# Patient Record
Sex: Female | Born: 1980
Health system: Southern US, Community
[De-identification: ages and names within clinical notes are randomized; demographics above are authoritative.]

## PROBLEM LIST (undated history)

## (undated) DIAGNOSIS — K2970 Gastritis, unspecified, without bleeding: Secondary | ICD-10-CM

## (undated) DIAGNOSIS — R300 Dysuria: Secondary | ICD-10-CM

## (undated) DIAGNOSIS — F410 Panic disorder [episodic paroxysmal anxiety] without agoraphobia: Secondary | ICD-10-CM

## (undated) DIAGNOSIS — E559 Vitamin D deficiency, unspecified: Secondary | ICD-10-CM

## (undated) DIAGNOSIS — G4700 Insomnia, unspecified: Secondary | ICD-10-CM

## (undated) DIAGNOSIS — I10 Essential (primary) hypertension: Secondary | ICD-10-CM

## (undated) DIAGNOSIS — R5383 Other fatigue: Secondary | ICD-10-CM

## (undated) DIAGNOSIS — R5381 Other malaise: Secondary | ICD-10-CM

## (undated) HISTORY — DX: Morbid (severe) obesity due to excess calories: E66.01

## (undated) HISTORY — DX: Essential (primary) hypertension: I10

## (undated) HISTORY — PX: TONSILLECTOMY: SHX5217

## (undated) HISTORY — DX: Vitamin D deficiency, unspecified: E55.9

## (undated) HISTORY — DX: Dysuria: R30.0

## (undated) HISTORY — DX: Other malaise: R53.83

## (undated) HISTORY — DX: Other malaise: R53.81

## (undated) HISTORY — DX: Panic disorder (episodic paroxysmal anxiety): F41.0

## (undated) HISTORY — DX: Insomnia, unspecified: G47.00

## (undated) HISTORY — DX: Gastritis, unspecified, without bleeding: K29.70

## (undated) HISTORY — PX: TUBAL LIGATION: SHX77

---

## 1998-06-20 ENCOUNTER — Other Ambulatory Visit: Admission: RE | Admit: 1998-06-20 | Discharge: 1998-06-20 | Payer: Self-pay | Admitting: Gynecology

## 1998-09-11 ENCOUNTER — Ambulatory Visit (HOSPITAL_COMMUNITY): Admission: RE | Admit: 1998-09-11 | Discharge: 1998-09-11 | Payer: Self-pay | Admitting: Obstetrics

## 1998-10-26 ENCOUNTER — Ambulatory Visit (HOSPITAL_COMMUNITY): Admission: RE | Admit: 1998-10-26 | Discharge: 1998-10-26 | Payer: Self-pay | Admitting: *Deleted

## 1998-11-25 ENCOUNTER — Emergency Department (HOSPITAL_COMMUNITY): Admission: EM | Admit: 1998-11-25 | Discharge: 1998-11-25 | Payer: Self-pay | Admitting: Emergency Medicine

## 2000-09-16 ENCOUNTER — Other Ambulatory Visit: Admission: RE | Admit: 2000-09-16 | Discharge: 2000-09-16 | Payer: Self-pay | Admitting: Family Medicine

## 2001-03-06 ENCOUNTER — Emergency Department (HOSPITAL_COMMUNITY): Admission: EM | Admit: 2001-03-06 | Discharge: 2001-03-06 | Payer: Self-pay

## 2004-01-18 ENCOUNTER — Emergency Department (HOSPITAL_COMMUNITY): Admission: EM | Admit: 2004-01-18 | Discharge: 2004-01-18 | Payer: Self-pay | Admitting: Family Medicine

## 2004-02-23 ENCOUNTER — Ambulatory Visit (HOSPITAL_BASED_OUTPATIENT_CLINIC_OR_DEPARTMENT_OTHER): Admission: RE | Admit: 2004-02-23 | Discharge: 2004-02-23 | Payer: Self-pay | Admitting: Otolaryngology

## 2004-02-23 ENCOUNTER — Encounter (INDEPENDENT_AMBULATORY_CARE_PROVIDER_SITE_OTHER): Payer: Self-pay | Admitting: Specialist

## 2004-02-23 ENCOUNTER — Ambulatory Visit (HOSPITAL_COMMUNITY): Admission: RE | Admit: 2004-02-23 | Discharge: 2004-02-23 | Payer: Self-pay | Admitting: Otolaryngology

## 2006-05-09 ENCOUNTER — Ambulatory Visit (HOSPITAL_COMMUNITY): Admission: RE | Admit: 2006-05-09 | Discharge: 2006-05-09 | Payer: Self-pay | Admitting: Obstetrics & Gynecology

## 2007-06-03 ENCOUNTER — Ambulatory Visit (HOSPITAL_COMMUNITY): Admission: RE | Admit: 2007-06-03 | Discharge: 2007-06-03 | Payer: Self-pay | Admitting: Obstetrics

## 2008-12-06 ENCOUNTER — Emergency Department (HOSPITAL_COMMUNITY): Admission: EM | Admit: 2008-12-06 | Discharge: 2008-12-06 | Payer: Self-pay | Admitting: Emergency Medicine

## 2011-01-04 NOTE — Op Note (Signed)
Dorothy Sullivan, Dorothy Sullivan            ACCOUNT NO.:  [id number]   MEDICAL RECORD NO.:  [id number]          PATIENT TYPE:  AMB   LOCATION:  SDC                           FACILITY:  [facility]   PHYSICIAN:  Roseanna Rainbow, M.D.DATE OF BIRTH:  1981-08-16   DATE OF PROCEDURE:  05/09/2006  DATE OF DISCHARGE:                                 OPERATIVE REPORT   PREOPERATIVE DIAGNOSIS:  Desires sterilization procedure.   POSTOPERATIVE DIAGNOSIS:  Desires sterilization procedure.   PROCEDURE:  Laparoscopic bilateral tubal ligation with fulguration.   SURGEON:  Dr. Tamela Oddi, Dr. Clearance Coots   ANESTHESIA:  General tracheal.   ESTIMATED BLOOD LOSS:  Minimal.   COMPLICATIONS:  None.   IV FLUIDS:  As per anesthesiology.   URINE OUTPUT:  100 mL urine at the beginning of the procedure.   FINDINGS:  There were adhesions involving the omentum to the parietal  peritoneum of the anterior abdominal wall.  The ovary on the left side was  involved in these adhesions which were fairly dense.  The adnexa was put on  stretch, and there was slight twisting of the adnexa related to this  adhesion.  The fimbriated end of the tube could not be visualized secondary  to the adhesions on the left side.   PROCEDURE:  The patient is taken to the operating room with an IV running.  She was given general anesthesia, placed in the dorsal lithotomy position,  and prepped and draped in the usual sterile fashion.  A bivalve speculum was  placed in the patient's vagina.  The anterior lip of the cervix was grasped  with a single-tooth tenaculum.  A Hulka manipulator was then advanced into  the uterus and secured to the anterior lip of the cervix as a means to  manipulate the uterus.  The single-tooth tenaculum and speculum were then  removed.  A midline supraumbilical skin incision was then made with a  scalpel and carried down to the underlying fascia.  The fascia was tented up  and entered sharply.  The posterior  rectus sheath was tented up and entered  sharply.  The parietal peritoneum was tented up and entered sharply as well.  The Hasson trocar and sleeve were then advanced into the abdomen.  The  abdomen was then insufflated with CO2 gas.  A survey of the pelvic viscera  revealed the above findings.  The mid isthmic portion of the right fallopian  tube was then cauterized.  A 2 cm segment of tube was cauterized  contiguously.  With each application, the ohmmeter was noted to go to zero.  The left fallopian tube was manipulated in a similar fashion.  All the  instruments were removed from the abdomen.  The fascia was reapproximated  with 0 Vicryl in a running fashion.  The skin was reapproximated with  interrupted sutures of 3-0 Monocryl and Dermabond.  The Hulka  manipulator was removed from the vagina with minimal bleeding noted.  At the  close of the procedure, the instrument and pack counts were said to be  correct x2.  The patient was taken to the PACU awake  and in stable  condition.      Roseanna Rainbow, M.D.  Electronically Signed     LAJ/MEDQ  D:  05/09/2006  T:  05/12/2006  Job:  [id number]

## 2011-01-04 NOTE — Op Note (Signed)
Dorothy Sullivan, Dorothy Sullivan                        ACCOUNT NO.:  [id number]   MEDICAL RECORD NO.:  [id number]                   PATIENT TYPE:  AMB   LOCATION:  DSC                                  FACILITY:  [facility]   PHYSICIAN:  Jefry H. Pollyann Kennedy, M.D.                DATE OF BIRTH:  [birth year]   DATE OF PROCEDURE:  02/23/2004  DATE OF DISCHARGE:                                 OPERATIVE REPORT   PREOPERATIVE DIAGNOSES:  Chronic tonsillitis.   POSTOPERATIVE DIAGNOSES:  Chronic tonsillitis.   OPERATION PERFORMED:  Tonsillectomy.   SURGEON:  Jefry H. Pollyann Kennedy, M.D.   ANESTHESIA:  General endotracheal.   COMPLICATIONS:  None.   ESTIMATED BLOOD LOSS:  Minimal.   FINDINGS:  Large tonsils with cryptic spaces and debris.  No adenoid tissue  present.   REFERRING PHYSICIAN:  Louise A. Twiselton, M.D.   INDICATIONS FOR PROCEDURE:  The patient is a 30 year old with a history of  chronic and recurring tonsillopharyngitis.  The risks, benefits,  alternatives and complications of the procedure were explained to the  patient, who seemed to understand and agreed to surgery.   DESCRIPTION OF PROCEDURE:  The patient was taken to the operating room and  placed on the operating table in the supine position.  Following induction  of general endotracheal anesthesia, the table was turned 90 degrees and the  patient was draped in standard fashion.  A Crowe-Davis mouth gag was  inserted into the oral cavity and used to retract the tongue and mandible  and attached to the Mayo stand.  Inspection of the palate revealed no  evidence of a submucous cleft or shortening of the soft palate.  A red  rubber catheter was inserted into the right side of the nose and withdrawn  through the mouth and used to retract the soft palate and uvula.  Indirect  exam of the nasopharynx was performed and the nasopharynx was clear.  The  tonsillectomy was performed using electrocautery dissection, carefully  dissecting the  avascular plane between the capsule and the constrictor  muscles.  There was minimal bleeding encountered.  Spot cautery was used as  needed.  Tonsils were sent together for pathologic evaluation.  The pharynx  was suctioned of secretions and irrigated with saline.  An orogastric tube  was used to aspirate the contents of the stomach.  The patient was then  awakened, extubated and transferred to recovery in stable condition.                                               Jefry H. Pollyann Kennedy, M.D.    JHR/MEDQ  D:  02/23/2004  T:  02/23/2004  Job:  [id number]

## 2012-12-14 ENCOUNTER — Ambulatory Visit (INDEPENDENT_AMBULATORY_CARE_PROVIDER_SITE_OTHER): Payer: 59 | Admitting: Obstetrics

## 2012-12-14 ENCOUNTER — Encounter: Payer: Self-pay | Admitting: Obstetrics

## 2012-12-14 VITALS — BP 119/84 | HR 80 | Temp 98.0°F | Ht 65.0 in | Wt 330.0 lb

## 2012-12-14 DIAGNOSIS — A499 Bacterial infection, unspecified: Secondary | ICD-10-CM

## 2012-12-14 DIAGNOSIS — N760 Acute vaginitis: Secondary | ICD-10-CM

## 2012-12-14 DIAGNOSIS — B9689 Other specified bacterial agents as the cause of diseases classified elsewhere: Secondary | ICD-10-CM

## 2012-12-14 DIAGNOSIS — N390 Urinary tract infection, site not specified: Secondary | ICD-10-CM

## 2012-12-14 DIAGNOSIS — Z01419 Encounter for gynecological examination (general) (routine) without abnormal findings: Secondary | ICD-10-CM

## 2012-12-14 DIAGNOSIS — Z113 Encounter for screening for infections with a predominantly sexual mode of transmission: Secondary | ICD-10-CM

## 2012-12-14 HISTORY — DX: Other specified bacterial agents as the cause of diseases classified elsewhere: B96.89

## 2012-12-14 HISTORY — DX: Acute vaginitis: N76.0

## 2012-12-14 LAB — POCT URINALYSIS DIPSTICK
Bilirubin, UA: NEGATIVE
Glucose, UA: NEGATIVE
Ketones, UA: NEGATIVE
Leukocytes, UA: NEGATIVE
Nitrite, UA: POSITIVE
Protein, UA: NEGATIVE
Spec Grav, UA: 1.02
Urobilinogen, UA: NEGATIVE
pH, UA: 5

## 2012-12-14 MED ORDER — METRONIDAZOLE 500 MG PO TABS: 500.0000 mg | ORAL_TABLET | Freq: Three times a day (TID) | ORAL | Status: DC

## 2012-12-14 MED ORDER — PRENATAL PLUS IRON 29-1 MG PO TABS: 1.0000 | ORAL_TABLET | Freq: Every day | ORAL | Status: DC

## 2012-12-14 NOTE — Progress Notes (Signed)
.   Subjective:     Dorothy Sullivan is a 32 y.o. female here for a annual exam.  No current complaints.  Personal health questionnaire reviewed: yes.   Gynecologic History Patient's last menstrual period was 12/01/2012. Contraception: tubal ligation Last Pap: 11/27/2011. Results were: normal Last mammogram: N/A  Obstetric History OB History   Grav Para Term Preterm Abortions TAB SAB Ect Mult Living                   The following portions of the patient's history were reviewed and updated as appropriate: allergies, current medications, past family history, past medical history, past social history, past surgical history and problem list.  Review of Systems Pertinent items are noted in HPI.    Objective:    General appearance: alert and no distress Breasts: normal appearance, no masses or tenderness Abdomen: normal findings: soft, non-tender Pelvic: cervix normal in appearance, external genitalia normal, no adnexal masses or tenderness, no cervical motion tenderness, uterus normal size, shape, and consistency and Vagina with thin gray discharge.    Assessment:    Healthy female exam.  BV.   Plan:    Education reviewed: safe sex/STD prevention and self breast exams. Follow up in: 1 year.   Flagyl Rx.

## 2012-12-15 ENCOUNTER — Encounter: Payer: Self-pay | Admitting: Obstetrics

## 2012-12-15 ENCOUNTER — Other Ambulatory Visit: Payer: Self-pay | Admitting: *Deleted

## 2012-12-15 LAB — GC/CHLAMYDIA PROBE AMP: GC Probe RNA: NEGATIVE

## 2012-12-15 LAB — PAP IG W/ RFLX HPV ASCU

## 2012-12-15 NOTE — Patient Instructions (Signed)
+  BV

## 2012-12-16 LAB — URINE CULTURE: Colony Count: >=100000

## 2012-12-22 ENCOUNTER — Other Ambulatory Visit: Payer: Self-pay | Admitting: *Deleted

## 2012-12-22 MED ORDER — NITROFURANTOIN MONOHYD MACRO 100 MG PO CAPS: 100.0000 mg | ORAL_CAPSULE | Freq: Two times a day (BID) | ORAL | Status: AC

## 2012-12-22 NOTE — Progress Notes (Signed)
Pt aware of results and prescription sent to pt's pharmacy. Pt expressed understanding.   

## 2013-09-19 ENCOUNTER — Emergency Department (INDEPENDENT_AMBULATORY_CARE_PROVIDER_SITE_OTHER)
Admission: EM | Admit: 2013-09-19 | Discharge: 2013-09-19 | Disposition: A | Discharge status: Home / Self Care | Payer: 59 | Source: Home / Self Care | Attending: Family Medicine | Admitting: Family Medicine

## 2013-09-19 ENCOUNTER — Encounter (HOSPITAL_COMMUNITY): Payer: Self-pay | Admitting: Emergency Medicine

## 2013-09-19 DIAGNOSIS — R6889 Other general symptoms and signs: Secondary | ICD-10-CM

## 2013-09-19 LAB — POCT RAPID STREP A: Streptococcus, Group A Screen (Direct): NEGATIVE

## 2013-09-19 MED ORDER — IBUPROFEN 800 MG PO TABS
800.0000 mg | ORAL_TABLET | Freq: Once | ORAL | Status: AC
  Administered 2013-09-19: 800 mg via ORAL

## 2013-09-19 MED ORDER — IBUPROFEN 800 MG PO TABS: 800.0000 mg | ORAL_TABLET | Freq: Three times a day (TID) | ORAL | Status: DC | PRN

## 2013-09-19 MED ORDER — OSELTAMIVIR PHOSPHATE 75 MG PO CAPS: 75.0000 mg | ORAL_CAPSULE | Freq: Two times a day (BID) | ORAL | Status: DC

## 2013-09-19 MED ORDER — IBUPROFEN 800 MG PO TABS
ORAL_TABLET | ORAL | Status: AC
  Filled 2013-09-19: qty 1

## 2013-09-19 NOTE — Discharge Instructions (Signed)
Influenza, Adult °Influenza (flu) is an infection in the mouth, nose, and throat (respiratory tract) caused by a virus. The flu can make you feel very ill. Influenza spreads easily from person to person (contagious).  °HOME CARE  °· Only take medicines as told by your doctor. °· Use a cool mist humidifier to make breathing easier. °· Get plenty of rest until your fever goes away. This usually takes 3 to 4 days. °· Drink enough fluids to keep your pee (urine) clear or pale yellow. °· Cover your mouth and nose when you cough or sneeze. °· Wash your hands well to avoid spreading the flu. °· Stay home from work or school until your fever has been gone for at least 1 full day. °· Get a flu shot every year. °GET HELP RIGHT AWAY IF:  °· You have trouble breathing or feel short of breath. °· Your skin or nails turn blue. °· You have severe neck pain or stiffness. °· You have a severe headache, facial pain, or earache. °· Your fever gets worse or keeps coming back. °· You feel sick to your stomach (nauseous), throw up (vomit), or have watery poop (diarrhea). °· You have chest pain. °· You have a deep cough that gets worse, or you cough up more thick spit (mucus). °MAKE SURE YOU:  °· Understand these instructions. °· Will watch your condition. °· Will get help right away if you are not doing well or get worse. °Document Released: 05/14/2008 Document Revised: 02/04/2012 Document Reviewed: 11/04/2011 °ExitCare® Patient Information ©2014 [employer]. ° °

## 2013-09-19 NOTE — ED Provider Notes (Signed)
Medical screening examination/treatment/procedure(s) were performed by resident physician or non-physician practitioner and as supervising physician I was immediately available for consultation/collaboration.   Sayana Salley DOUGLAS MD.   Roman Sandall D Christe Tellez, MD 09/19/13 1230 

## 2013-09-19 NOTE — ED Notes (Signed)
C/o cold sx States she has a cough, runny nose, and head pressure for two days now

## 2013-09-19 NOTE — ED Provider Notes (Signed)
CSN: [id number]     Arrival date & time 09/19/13  0911 History   First MD Initiated Contact with Patient 09/19/13 0935     Chief Complaint  Patient presents with  . URI     Patient is a 33 y.o. female presenting with pharyngitis. The history is provided by the patient.  Sore Throat This is a new problem. The current episode started 2 days ago. The problem occurs constantly. The problem has been gradually worsening. Associated symptoms include headaches. Pertinent negatives include no chest pain, no abdominal pain and no shortness of breath. Nothing aggravates the symptoms. She has tried nothing for the symptoms.  Pt reports onset of sore throat Friday night followed by malaise, body aches, runny nose, chills and headache. Has also had some mild (L) ear discomfort and a dry hacking cough that started today.   History reviewed. No pertinent past medical history. Past Surgical History  Procedure Laterality Date  . Cesarean section    . Tubal ligation    . Tonsillectomy     Family History  Problem Relation Age of Onset  . Heart disease Paternal Grandfather    History  Substance Use Topics  . Smoking status: Never Smoker   . Smokeless tobacco: Never Used  . Alcohol Use: No   OB History   Grav Para Term Preterm Abortions TAB SAB Ect Mult Living                 Review of Systems  Constitutional: Positive for chills and fatigue. Negative for fever.  HENT: Positive for ear pain, rhinorrhea and sore throat. Negative for postnasal drip, sinus pressure, sneezing, trouble swallowing and voice change.   Eyes: Negative.   Respiratory: Positive for cough. Negative for shortness of breath.   Cardiovascular: Negative.  Negative for chest pain.  Gastrointestinal: Negative.  Negative for abdominal pain.  Endocrine: Negative.   Genitourinary: Negative.   Musculoskeletal: Negative.   Allergic/Immunologic: Negative.   Neurological: Positive for headaches.  Hematological: Negative.    Psychiatric/Behavioral: Negative.     Allergies  Review of patient's allergies indicates no known allergies.  Home Medications   Current Outpatient Rx  Name  Route  Sig  Dispense  Refill  . metroNIDAZOLE (FLAGYL) 500 MG tablet   Oral   Take 1 tablet (500 mg total) by mouth 3 (three) times daily.   14 tablet   2   . Prenatal Vit-Iron Carbonyl-FA (PRENATAL PLUS IRON) 29-1 MG TABS   Oral   Take 1 tablet by mouth daily before supper.   30 tablet   11    BP 129/84  Pulse 92  Temp(Src) 98.4 F (36.9 C) (Oral)  Resp 18  SpO2 97%  LMP 08/31/2013 Physical Exam  Constitutional: She is oriented to person, place, and time. She appears well-developed and well-nourished.  Non-toxic appearance. She has a sickly appearance. She does not appear ill. No distress.  HENT:  Head: Normocephalic and atraumatic.  Right Ear: Tympanic membrane, external ear and ear canal normal.  Left Ear: Tympanic membrane, external ear and ear canal normal.  Nose: Nose normal. Right sinus exhibits no maxillary sinus tenderness and no frontal sinus tenderness. Left sinus exhibits no maxillary sinus tenderness and no frontal sinus tenderness.  Mouth/Throat: Uvula is midline and mucous membranes are normal. Posterior oropharyngeal erythema present. No oropharyngeal exudate, posterior oropharyngeal edema or tonsillar abscesses.  Eyes: Conjunctivae are normal. Right eye exhibits no discharge. Left eye exhibits no discharge.  Neck: Neck supple.  Cardiovascular: Normal rate and regular rhythm.   Pulmonary/Chest: Effort normal and breath sounds normal.  Musculoskeletal: Normal range of motion.  Neurological: She is alert and oriented to person, place, and time.  Skin: Skin is warm and dry.  Psychiatric: She has a normal mood and affect.    ED Course  Procedures (including critical care time) Labs Review Labs Reviewed  POCT RAPID STREP A ([facility]ARE ONLY)   Imaging Review No results found.    MDM  No  diagnosis found.  < 48 h/o flu-like symptoms. No known fever but positive chills. Exam notable for pharyngeal erythema but otherwise normal. Will treat with Tamiflu and encourage Ibuprofen for body aches and h/a, rest and po fluids. Pt agreeable w/ plan.    Leanne Chang, NP 09/19/13 1041

## 2013-09-21 LAB — CULTURE, GROUP A STREP

## 2015-04-12 ENCOUNTER — Ambulatory Visit (INDEPENDENT_AMBULATORY_CARE_PROVIDER_SITE_OTHER): Payer: 59 | Admitting: Obstetrics

## 2015-04-12 ENCOUNTER — Encounter: Payer: Self-pay | Admitting: Obstetrics

## 2015-04-12 VITALS — BP 116/80 | HR 97 | Temp 98.3°F | Ht 65.0 in | Wt 348.0 lb

## 2015-04-12 DIAGNOSIS — Z01419 Encounter for gynecological examination (general) (routine) without abnormal findings: Secondary | ICD-10-CM

## 2015-04-12 DIAGNOSIS — N393 Stress incontinence (female) (male): Secondary | ICD-10-CM

## 2015-04-12 DIAGNOSIS — N946 Dysmenorrhea, unspecified: Secondary | ICD-10-CM | POA: Diagnosis not present

## 2015-04-12 DIAGNOSIS — Z0000 Encounter for general adult medical examination without abnormal findings: Secondary | ICD-10-CM

## 2015-04-12 DIAGNOSIS — Z124 Encounter for screening for malignant neoplasm of cervix: Secondary | ICD-10-CM

## 2015-04-12 DIAGNOSIS — N3281 Overactive bladder: Secondary | ICD-10-CM

## 2015-04-12 MED ORDER — IBUPROFEN 800 MG PO TABS: 800.0000 mg | ORAL_TABLET | Freq: Three times a day (TID) | ORAL | Status: DC | PRN

## 2015-04-12 MED ORDER — PNV PRENATAL PLUS MULTIVITAMIN 27-1 MG PO TABS
1.0000 | ORAL_TABLET | Freq: Every day | ORAL | Status: DC
Start: 2015-04-12 — End: 2015-09-11

## 2015-04-12 NOTE — Progress Notes (Signed)
Subjective:        Dorothy Sullivan is a 34 y.o. female here for a routine exam.  Current complaints: Leaking of urine with cough, sneeze, ect.  Also has urinary frequency and urgency.  Personal health questionnaire:  Is patient Ashkenazi Jewish, have a family history of breast and/or ovarian cancer: no Is there a family history of uterine cancer diagnosed at age < 48, gastrointestinal cancer, urinary tract cancer, family member who is a Personnel officer syndrome-associated carrier: no Is the patient overweight and hypertensive, family history of diabetes, personal history of gestational diabetes, preeclampsia or PCOS: no Is patient over 51, have PCOS,  family history of premature CHD under age 49, diabetes, smoke, have hypertension or peripheral artery disease:  no At any time, has a partner hit, kicked or otherwise hurt or frightened you?: no Over the past 2 weeks, have you felt down, depressed or hopeless?: no Over the past 2 weeks, have you felt little interest or pleasure in doing things?:no   Gynecologic History Patient's last menstrual period was 04/02/2015 (approximate). Contraception: none Last Pap: 2015. Results were: normal Last mammogram: n/a. Results were: n/a  Obstetric History OB History  No data available    History reviewed. No pertinent past medical history.  Past Surgical History  Procedure Laterality Date  . Cesarean section    . Tubal ligation    . Tonsillectomy       Current outpatient prescriptions:  .  ibuprofen (ADVIL,MOTRIN) 800 MG tablet, Take 1 tablet (800 mg total) by mouth every 8 (eight) hours as needed. For headache and body aches. (Patient not taking: Reported on 04/12/2015), Disp: 21 tablet, Rfl: 0 .  ibuprofen (ADVIL,MOTRIN) 800 MG tablet, Take 1 tablet (800 mg total) by mouth every 8 (eight) hours as needed., Disp: 30 tablet, Rfl: 5 .  metroNIDAZOLE (FLAGYL) 500 MG tablet, Take 1 tablet (500 mg total) by mouth 3 (three) times daily. (Patient not  taking: Reported on 04/12/2015), Disp: 14 tablet, Rfl: 2 .  oseltamivir (TAMIFLU) 75 MG capsule, Take 1 capsule (75 mg total) by mouth every 12 (twelve) hours. (Patient not taking: Reported on 04/12/2015), Disp: 10 capsule, Rfl: 0 .  Prenatal Vit-Fe Fumarate-FA (PNV PRENATAL PLUS MULTIVITAMIN) 27-1 MG TABS, Take 1 tablet by mouth daily before breakfast., Disp: 30 tablet, Rfl: 11 .  Prenatal Vit-Iron Carbonyl-FA (PRENATAL PLUS IRON) 29-1 MG TABS, Take 1 tablet by mouth daily before supper. (Patient not taking: Reported on 04/12/2015), Disp: 30 tablet, Rfl: 11 No Known Allergies  Social History  Substance Use Topics  . Smoking status: Never Smoker   . Smokeless tobacco: Never Used  . Alcohol Use: No    Family History  Problem Relation Age of Onset  . Heart disease Paternal Grandfather       Review of Systems  Constitutional: negative for fatigue and weight loss Respiratory: negative for cough and wheezing Cardiovascular: negative for chest pain, fatigue and palpitations Gastrointestinal: negative for abdominal pain and change in bowel habits Musculoskeletal:negative for myalgias Neurological: negative for gait problems and tremors Behavioral/Psych: negative for abusive relationship, depression Endocrine: negative for temperature intolerance   Genitourinary:negative for genital lesions, hot flashes, sexual problems and vaginal discharge.  Positive for painful periods. Integument/breast: negative for breast lump, breast tenderness, nipple discharge and skin lesion(s)    Objective:       BP 116/80 mmHg  Pulse 97  Temp(Src) 98.3 F (36.8 C)  Ht  (1.651 m)  Wt 348 lb (157.852 kg)  BMI 57.91 kg/m2  LMP 04/02/2015 (Approximate) General:   alert  Skin:   no rash or abnormalities  Lungs:   clear to auscultation bilaterally  Heart:   regular rate and rhythm, S1, S2 normal, no murmur, click, rub or gallop  Breasts:   normal without suspicious masses, skin or nipple changes or  axillary nodes  Abdomen:  normal findings: no organomegaly, soft, non-tender and no hernia  Pelvis:  External genitalia: normal general appearance Urinary system: urethral meatus normal and bladder without fullness, nontender Vaginal: normal without tenderness, induration or masses Cervix: normal appearance Adnexa: normal bimanual exam Uterus: anteverted and non-tender, normal size   Lab Review Urine pregnancy test Labs reviewed yes Radiologic studies reviewed no    Assessment:    Healthy female exam.    SUI and possible OAB  Morbid obesity  Dysmenorrhea   Plan:    Education reviewed: low fat, low cholesterol diet, safe sex/STD prevention, skin cancer screening and weight bearing exercise. Follow up in: 1 year.  Referred to Urology Diet, exercise and medical management recommended for obesity.  May consider Bariatric Surgery. Ibuprofen Rx for Dysmenorrhea   Meds ordered this encounter  Medications  . Prenatal Vit-Fe Fumarate-FA (PNV PRENATAL PLUS MULTIVITAMIN) 27-1 MG TABS    Sig: Take 1 tablet by mouth daily before breakfast.    Dispense:  30 tablet    Refill:  11  . ibuprofen (ADVIL,MOTRIN) 800 MG tablet    Sig: Take 1 tablet (800 mg total) by mouth every 8 (eight) hours as needed.    Dispense:  30 tablet    Refill:  5   Orders Placed This Encounter  Procedures  . SureSwab, Vaginosis/Vaginitis Plus  . Urine culture  . Ambulatory referral to Urology    Referral Priority:  Routine    Referral Type:  Consultation    Referral Reason:  Specialty Services Required    Requested Specialty:  Urology    Number of Visits Requested:  1

## 2015-04-14 LAB — URINE CULTURE: Colony Count: 50000

## 2015-04-15 LAB — PAP, TP IMAGING W/ HPV RNA, RFLX HPV TYPE 16,18/45: HPV mRNA, High Risk: NOT DETECTED

## 2015-04-15 LAB — SURESWAB, VAGINOSIS/VAGINITIS PLUS
ATOPOBIUM VAGINAE: DETECTED Log (cells/mL)
C. ALBICANS, DNA: NOT DETECTED
C. TRACHOMATIS RNA, TMA: NOT DETECTED
C. glabrata, DNA: NOT DETECTED
C. parapsilosis, DNA: NOT DETECTED
C. tropicalis, DNA: NOT DETECTED
Gardnerella vaginalis: 6.2 Log (cells/mL)
LACTOBACILLUS SPECIES: NOT DETECTED Log (cells/mL)
MEGASPHAERA SPECIES: 5.4 Log (cells/mL)
N. GONORRHOEAE RNA, TMA: NOT DETECTED
T. vaginalis RNA, QL TMA: DETECTED — AB

## 2015-04-16 ENCOUNTER — Other Ambulatory Visit: Payer: Self-pay | Admitting: Obstetrics

## 2015-04-16 DIAGNOSIS — B9689 Other specified bacterial agents as the cause of diseases classified elsewhere: Secondary | ICD-10-CM

## 2015-04-16 DIAGNOSIS — N760 Acute vaginitis: Principal | ICD-10-CM

## 2015-04-16 MED ORDER — METRONIDAZOLE 0.75 % VA GEL
1.0000 | Freq: Two times a day (BID) | VAGINAL | Status: DC
Start: 2015-04-16 — End: 2015-09-11

## 2015-04-16 MED ORDER — METRONIDAZOLE 500 MG PO TABS: 2000.0000 mg | ORAL_TABLET | Freq: Once | ORAL | Status: DC

## 2015-04-17 ENCOUNTER — Telehealth: Payer: Self-pay | Admitting: *Deleted

## 2015-04-17 NOTE — Telephone Encounter (Signed)
Patient states the Metrogel is $103 at the pharmacy and would like an alternative. 2:46 Call the pharmacy- patient has a deductible with her insurance and the price is correct- will check with provider for alternative.

## 2015-04-18 ENCOUNTER — Telehealth: Payer: Self-pay

## 2015-04-18 NOTE — Telephone Encounter (Signed)
spoke with patient, she has appt at [facility] on 05/24/15 with Dr. Lorin Picket macdiarmid at 1:15pm - she has their number and address as well

## 2015-04-20 ENCOUNTER — Other Ambulatory Visit: Payer: 59

## 2015-04-20 ENCOUNTER — Telehealth: Payer: Self-pay | Admitting: *Deleted

## 2015-04-20 DIAGNOSIS — N390 Urinary tract infection, site not specified: Secondary | ICD-10-CM

## 2015-04-20 DIAGNOSIS — B379 Candidiasis, unspecified: Secondary | ICD-10-CM

## 2015-04-20 DIAGNOSIS — T3695XA Adverse effect of unspecified systemic antibiotic, initial encounter: Secondary | ICD-10-CM

## 2015-04-20 DIAGNOSIS — Z202 Contact with and (suspected) exposure to infections with a predominantly sexual mode of transmission: Secondary | ICD-10-CM

## 2015-04-20 LAB — RPR

## 2015-04-20 LAB — TSH: TSH: 2.083 u[IU]/mL (ref 0.350–4.500)

## 2015-04-20 MED ORDER — FLUCONAZOLE 150 MG PO TABS: 150.0000 mg | ORAL_TABLET | ORAL | Status: DC

## 2015-04-20 MED ORDER — AMOXICILLIN 500 MG PO CAPS
500.0000 mg | ORAL_CAPSULE | Freq: Three times a day (TID) | ORAL | Status: DC
Start: 2015-04-20 — End: 2015-09-11

## 2015-04-20 NOTE — Telephone Encounter (Signed)
Patient has questions about her results- and labs needed. 3:34 Patient notified after review- she does have a UTI and Rx sent to pharmacy. Patient is concerned over Trich- and ask if sexual contact is the only way it is transmitted. Discussed that.  Metrogel was too expensive- sample of Clindesse to front for patient Diflucan sent to pharmacy for yeast.

## 2015-04-21 LAB — HIV ANTIBODY (ROUTINE TESTING W REFLEX): HIV: NONREACTIVE

## 2015-08-24 ENCOUNTER — Ambulatory Visit: Payer: 59 | Admitting: Neurology

## 2015-09-11 ENCOUNTER — Encounter: Payer: Self-pay | Admitting: Neurology

## 2015-09-11 ENCOUNTER — Ambulatory Visit (INDEPENDENT_AMBULATORY_CARE_PROVIDER_SITE_OTHER): Payer: 59 | Admitting: Neurology

## 2015-09-11 VITALS — BP 112/86 | HR 94 | Resp 20 | Ht 65.0 in | Wt 343.0 lb

## 2015-09-11 DIAGNOSIS — R0683 Snoring: Secondary | ICD-10-CM

## 2015-09-11 DIAGNOSIS — R51 Headache: Secondary | ICD-10-CM

## 2015-09-11 DIAGNOSIS — R519 Headache, unspecified: Secondary | ICD-10-CM | POA: Insufficient documentation

## 2015-09-11 DIAGNOSIS — R5383 Other fatigue: Secondary | ICD-10-CM | POA: Insufficient documentation

## 2015-09-11 DIAGNOSIS — G478 Other sleep disorders: Secondary | ICD-10-CM

## 2015-09-11 DIAGNOSIS — G43101 Migraine with aura, not intractable, with status migrainosus: Secondary | ICD-10-CM | POA: Diagnosis not present

## 2015-09-11 DIAGNOSIS — E66813 Obesity, class 3: Secondary | ICD-10-CM | POA: Insufficient documentation

## 2015-09-11 DIAGNOSIS — T732XXS Exhaustion due to exposure, sequela: Secondary | ICD-10-CM

## 2015-09-11 MED ORDER — SUMATRIPTAN SUCCINATE 50 MG PO TABS: 50.0000 mg | ORAL_TABLET | ORAL | Status: DC | PRN

## 2015-09-11 NOTE — Addendum Note (Signed)
Addended by: Melvyn Novas on: 09/11/2015 10:41 AM   Modules accepted: Orders

## 2015-09-11 NOTE — Patient Instructions (Addendum)
Obesity Obesity is defined as having too much total body fat and a body mass index (BMI) of 30 or more. BMI is an estimate of body fat and is calculated from your height and weight. BMI is typically calculated by your health care provider during regular wellness visits. Obesity happens when you consume more calories than you can burn by exercising or performing daily physical tasks. Prolonged obesity can cause major illnesses or emergencies, such as:  Stroke.  Heart disease.  Diabetes.  Cancer.  Arthritis.  High blood pressure (hypertension).  High cholesterol.  Sleep apnea.  Erectile dysfunction.  Infertility problems. CAUSES   Regularly eating unhealthy foods.  Physical inactivity.  Certain disorders, such as an underactive thyroid (hypothyroidism), Cushing's syndrome, and polycystic ovarian syndrome.  Certain medicines, such as steroids, some depression medicines, and antipsychotics.  Genetics.  Lack of sleep. DIAGNOSIS A health care provider can diagnose obesity after calculating your BMI. Obesity will be diagnosed if your BMI is 30 or higher. There are other methods of measuring obesity levels. Some other methods include measuring your skinfold thickness, your waist circumference, and comparing your hip circumference to your waist circumference. TREATMENT  A healthy treatment program includes some or all of the following:  Long-term dietary changes.  Exercise and physical activity.  Behavioral and lifestyle changes.  Medicine only under the supervision of your health care provider. Medicines may help, but only if they are used with diet and exercise programs. If your BMI is 40 or higher, your health care provider may recommend specialized surgery or programs to help with weight loss. An unhealthy treatment program includes:  Fasting.  Fad diets.  Supplements and drugs. These choices do not succeed in long-term weight control. HOME CARE  INSTRUCTIONS  Exercise and perform physical activity as directed by your health care provider. To increase physical activity, try the following:  Use stairs instead of elevators.  Park farther away from store entrances.  Garden, bike, or walk instead of watching television or using the computer.  Eat healthy, low-calorie foods and drinks on a regular basis. Eat more fruits and vegetables. Use low-calorie cookbooks or take healthy cooking classes.  Limit fast food, sweets, and processed snack foods.  Eat smaller portions.  Keep a daily journal of everything you eat. There are many free websites to help you with this. It may be helpful to measure your foods so you can determine if you are eating the correct portion sizes.  Avoid drinking alcohol. Drink more water and drinks without calories.  Take vitamins and supplements only as recommended by your health care provider.  Weight-loss support groups, Government social research officer, counselors, and stress reduction education can also be very helpful. SEEK IMMEDIATE MEDICAL CARE IF:  You have chest pain or tightness.  You have trouble breathing or feel short of breath.  You have weakness or leg numbness.  You feel confused or have trouble talking.  You have sudden changes in your vision.   This information is not intended to replace advice given to you by your health care provider. Make sure you discuss any questions you have with your health care provider.   Document Released: 09/12/2004 Document Revised: 08/26/2014 Document Reviewed: 09/11/2011 Elsevier Interactive Patient Education 2016 [employer]. Sleep Studies A sleep study (polysomnogram) is a series of tests done while you are sleeping. It can show how well you sleep. This can help your health care provider diagnose a sleep disorder and show how severe your sleep disorder is. A sleep study  may lead to treatment that will help you sleep better and prevent other medical problems  caused by poor sleep. If you have a sleep disorder, you may also be at risk for:   Sleep-related accidents.  High blood pressure.  Heart disease.  Stroke.  Other medical conditions. Sleep disorders are common. Your health care provider may suspect a sleep disorder if you:  Have loud snoring most nights.  Have brief periods when you stop breathing at night.  Feel sleepy on most days.  Fall asleep suddenly during the day.  Have trouble falling asleep or staying asleep.  Feel like you need to move your legs when trying to fall asleep.  Have dreams that seem very real shortly after falling asleep.  Feel like you cannot move when you first wake up. WHICH TESTS WILL I NEED TO HAVE?  Most sleep studies last all night and include these tests:  Recordings of your brain activity.  Recordings of your eye movements.  Recording of your heart rate and rhythm.  Blood pressure readings.  Readings of the amount of oxygen in your blood.  Measurements of your chest and belly movement as you breathe during sleep. If you have signs of the sleep disorder called sleep apnea during your test, you may get a mask to wear for the second half of the night.   The mask provides continuous positive airway pressure (CPAP). This may improve sleep apnea significantly.  You will then have all tests done again with the mask in place to see if your measurements and recordings change. HOW ARE SLEEP STUDIES DONE? Most sleep studies are done over one full night of sleep.   You will arrive at the study center in the evening and can go home in the morning.  Bring your pajamas and toothbrush.  Do not have caffeine on the day of your sleep study.  Your health care provider will let you know if you need to stop taking any of your regular medicines before the test. To do the tests included in a polysomnogram, you will have:  Round, sticky patches with sensors attached to recording wires (electrodes)  placed on your scalp, face, chest, and limbs.  Wires from all the electrodes and sensors run from your bed to a computer. The wires can be taken off and put back on if you need to get out of bed to go to the bathroom.  A sensor placed over your nose to measure airflow.  A finger clip put on one finger to measure your blood oxygen level.  A belt around your belly and a belt around your chest to measure breathing movements. WHERE ARE SLEEP STUDIES DONE?  Sleep studies are done at sleep centers. A sleep center may be inside a hospital, office, or clinic.  The room where you have the study may look like a hospital room or a hotel room. The health care providers doing the study may come in and out of the room during the study. Most of the time, they will be in another room monitoring your test.  HOW IS INFORMATION FROM SLEEP STUDIES HELPFUL? A polysomnogram can be used along with your medical history and a physical exam to diagnose conditions, such as:  Sleep apnea.  Restless legs syndrome.  Sleep-related seizure disorders.  Sleep-related movement disorders. A medical doctor who specializes in sleep will evaluate your sleep study. The specialist will share the results with your primary health care provider. Treatments based on your sleep study may  include:  Improving your sleep habits (sleep hygiene).  Wearing a CPAP mask.  Wearing an oral device at night to improve breathing and reduce snoring.  Taking medicine for:  Restless legs syndrome.  Sleep-related seizure disorder.  Sleep-related movement disorder.   This information is not intended to replace advice given to you by your health care provider. Make sure you discuss any questions you have with your health care provider.   Document Released: 02/09/2003 Document Revised: 08/26/2014 Document Reviewed: 10/11/2013 Elsevier Interactive Patient Education [employer]

## 2015-09-11 NOTE — Progress Notes (Signed)
SLEEP MEDICINE CLINIC   Provider:  Melvyn Novas, M D  Referring Provider: Dorothyann Peng, MD Primary Care Physician:  Gwynneth Aliment, MD  Chief Complaint  Patient presents with  . New Patient (Initial Visit)    migraines, (pt denies having HTN or discussing sleep with her PCP), having 1-2 migraines per week, taking fioricet, rm 11, alone    HPI:  Dorothy Sullivan is a 35 y.o. female , seen here as a referral  from Dr. Allyne Gee for a sleep consultation, Mrs. Wolfley reports that she had a discussion with her primary care physician about her frequent headaches on a pain scale of 10 she would endorse 8 in intensity , usually bilateral frontal headaches, occurring about twice a week. She frequently wakes up with headaches in the morning this is actually more often the case and not. Headaches have also woken her out of sleep. The headaches that would wake her out of sleep.is dull and are more sharp and sudden headache attacks, likely a form of episodic cluster headaches. The morning headaches are more dull and constant. The patient also has a comorbidity of pre-diabetes and has been on Victoza. She complained about insomnia or poor sleep and daytime fatigue. She has been treated for essential hypertension, vitamin D deficiency, gastritis and morbid obesity. She has taken Ambien as well as Xanax to help her with insomnia and panic attacks. Her concern is that she does not get enough sleep and that she has ongoing trouble to initiate sleep. This is frustrated and also anxiety provoking. She has never been tested for sleep before, and there is no record of hypoxemia or hypercapnia. Recently Dr. Lelon Perla performed a comprehensive metabolic panel, and Hb A1 C and a TSH and vitamin D.  Sleep habits are as follows: The patient reports that she has a regular bedtimes and rises the same time every morning about 6:30 AM her work begins at 8 AM. She goes usually to the bedroom at about 9 PM but she has  trouble falling asleep. Even with Ambien and Xanax she has not been able to achieve sleep induction. It may take up to an hour for her to fall asleep which frustrates her. If she is asleep by about 10 PM she will sleep through for about 4 hours before she has her first interruption of sleep. She is unaware what wakes her this seems to be a spontaneous event sometimes she will go to the bathroom but it was not the urge to urinate that woke her. He is usually headache free at that time. Some nights she will be able to fall asleep again and will sleep for another 4 hours, some nights she will wake up every hour. Her total number of bathroom breaks is only about 1 per night. She rarely is woken by headaches but she frequently wakes with headaches, she drinks water at night and prior to bedtime. He does not necessarily wake with a dry mouth. She rarely has vivid dreams, sleeps in a dark, quiet and cool bedroom. Her boyfriend states that she will moan at night but he has not told her that she snores loudly and stops breathing in her sleep. Unfortunately he is not here today. She has no preferred sleep position ,sleeps some nights prone, others on her back, sometimes on her side. She would average usually about 6 hours of sleep each night. When she rises in the morning she usually drinks a couple of coffees and then goes to work. She  does not have a regular breakfast habit. She does not consume any other forms of caffeine except for the coffee.  She works from home, [employer].   Sleep medical history and family sleep history: parents were snorers.   Social history: caffeine 2-3 daily in form of coffee, she is a social drinker of alcohol she does not smoke or use tobacco in any form. Her boyfriend does not smoke in the home.  Review of Systems: Out of a complete 14 system review, the patient complains of only the following symptoms, and all other reviewed systems are negative.  Epworth score  1 , Fatigue  severity score 44  , depression score 2    Social History   Social History  . Marital Status: Single    Spouse Name: N/A  . Number of Children: N/A  . Years of Education: N/A   Occupational History  .      [employer]   Social History Main Topics  . Smoking status: Never Smoker   . Smokeless tobacco: Never Used  . Alcohol Use: 0.0 oz/week    0 Standard drinks or equivalent per week     Comment: occasional  . Drug Use: No  . Sexual Activity: Yes    Birth Control/ Protection: Surgical   Other Topics Concern  . Not on file   Social History Narrative   Drinks 2 cups of caffeine daily.    Family History  Problem Relation Age of Onset  . Heart disease Paternal Grandfather   . Aneurysm Father   . Migraines Sister     Past Medical History  Diagnosis Date  . Insomnia   . Morbid obesity (HCC)   . Gastritis   . Malaise and fatigue   . Malignant essential hypertension   . Dysuria   . Vitamin D deficiency   . Benign essential hypertension   . Panic disorder     Past Surgical History  Procedure Laterality Date  . Cesarean section    . Tubal ligation    . Tonsillectomy      Current Outpatient Prescriptions  Medication Sig Dispense Refill  . ALPRAZolam (XANAX) 0.5 MG tablet Take 0.5 mg by mouth 2 (two) times daily as needed for anxiety.    . Butalbital-APAP-Caffeine 50-300-40 MG CAPS take 1-2 capsules by mouth every 6 hours if needed NOT TO EXCEED 4 CAPSULES PER 24 HOURS  0  . ibuprofen (ADVIL,MOTRIN) 800 MG tablet Take 1 tablet (800 mg total) by mouth every 8 (eight) hours as needed. For headache and body aches. 21 tablet 0  . ibuprofen (ADVIL,MOTRIN) 800 MG tablet Take 1 tablet (800 mg total) by mouth every 8 (eight) hours as needed. 30 tablet 5  . pantoprazole (PROTONIX) 40 MG tablet Take 40 mg by mouth daily.    Marland Kitchen zolpidem (AMBIEN) 10 MG tablet take 1 tablet by mouth at bedtime if needed  0   No current facility-administered medications for this visit.    Allergies  as of 09/11/2015  . (No Known Allergies)    Vitals: BP 112/86 mmHg  Pulse 94  Resp 20  Ht  (1.651 m)  Wt 343 lb (155.584 kg)  BMI 57.08 kg/m2 Last Weight:  Wt Readings from Last 1 Encounters:  09/11/15 343 lb (155.584 kg)   ZOX:WRUE mass index is 57.08 kg/(m^2).     Last Height:   Ht Readings from Last 1 Encounters:  09/11/15  (1.651 m)  gained 62 pounds in 2.5 years !  Physical exam:  General: The patient is awake, alert and appears not in acute distress. The patient is well groomed. Head: Normocephalic, atraumatic. Neck is supple. Mallampati 4 ,  neck circumference:17.45 . Nasal airflow unrestricted  TMJ is  not evident . Retrognathia is seen.  Cardiovascular:  Regular rate and rhythm , without  murmurs or carotid bruit, and without distended neck veins. Respiratory: Lungs are clear to auscultation.- Skin:  Without evidence of edema, or rash Trunk: BMI is elevated to 57 The patient's posture is erect   Neurologic exam : The patient is awake and alert, oriented to place and time.   Memory subjective described as intact.  Attention span & concentration ability appears normal.  Speech is fluent,  without   dysarthria, dysphonia or aphasia.  Mood and affect are appropriate.  Cranial nerves: Pupils are equal and briskly reactive to light. Funduscopic exam without  evidence of pallor or edema. Extraocular movements  in vertical and horizontal planes intact and without nystagmus. Visual fields by finger perimetry are intact. Hearing to finger rub intact.   Facial sensation intact to fine touch.  Facial motor strength is symmetric and tongue and uvula move midline. Shoulder shrug was symmetrical.   Motor exam:  Normal tone, muscle bulk and symmetric strength in all extremities.  Sensory:  Fine touch, pinprick and vibration were normal. Coordination: Rapid alternating movements  and finger-to-nose maneuver  normal without evidence of ataxia, dysmetria or  tremor.  Gait and station: Patient walks without assistive device and is able unassisted to climb up to the exam table. Strength within normal limits.  Stance is stable and normal.  Toe and heel stand and gait were tested .Tandem gait deferred - Turns with 3 Steps.  Deep tendon reflexes: in the upper and lower extremities are symmetric and intact. Babinski maneuver response is downgoing.  The patient was advised of the nature of the diagnosed sleep disorder , the treatment options and risks for general a health and wellness arising from not treating the condition.  I spent more than  40 minutes of face to face time with the patient. Greater than 50% of time was spent in counseling and coordination of care. We have discussed the diagnosis and differential and I answered the patient's questions.     Assessment:  After physical and neurologic examination, review of laboratory studies,  Personal review of imaging studies, reports of other /same  Imaging studies ,  Results of polysomnography/ neurophysiology testing and pre-existing records as far as provided in visit., my assessment is   1) Mrs. Berres frequent headaches have a migrainous component and are associated this morning time arousals. There is a possibility that hypoxemia would cause these. In addition not as frequent she has been woken by headache attacks that were more sharp and sudden, these pains usually arises around the eye and are cluster headaches. They may occur out of slow-wave sleep or dream sleep. These are frequently associated with hypoxemia sometimes with hypercapnia. For this reason I will order an attended sleep study with capnography for the patient.  2) the patient may have obesity hypoventilation leading to hypoxemia and hypercapnia. She is not excessively daytime sleepy but very fatigued. He does not take daytime naps. She does not struggle with the irresistible urge to go to sleep. On the contrary she has some insomnia  difficulties and treatment for stomach and Ambien has no longer been successful. I will check if sleep apnea could be a reason for the insomnia and  frequent morning arousals.  3) she is unaware of why she gained a significant amount of weight in a very short time. She states that her eating habits exercise habits did not change she developed plantar fasciitis which would limit exercise to some degree but her weight gain is insufficiently explained. I have wondered if depression may play a role but she is sure that depression is not what bothered her she feels more frustrated.     Plan:  Treatment plan and additional workup :  attended split-night polysomnography with capnography.  Dr. Allyne Gee will address a weight loss program, the patient may be a candidate for weight loss surgery even.  PH Q9 obtained.   Porfirio Mylar Talyah Seder MD  09/11/2015   CC: Dorothyann Peng, Md [address] [place] [address] [place], [place] [address]

## 2016-03-18 ENCOUNTER — Telehealth: Payer: Self-pay

## 2016-03-19 ENCOUNTER — Encounter: Payer: Self-pay | Admitting: Obstetrics

## 2016-03-19 ENCOUNTER — Ambulatory Visit (INDEPENDENT_AMBULATORY_CARE_PROVIDER_SITE_OTHER): Payer: 59 | Admitting: Obstetrics

## 2016-03-19 VITALS — BP 152/97 | HR 88 | Temp 98.9°F | Wt 341.6 lb

## 2016-03-19 DIAGNOSIS — I10 Essential (primary) hypertension: Secondary | ICD-10-CM

## 2016-03-19 DIAGNOSIS — Z01419 Encounter for gynecological examination (general) (routine) without abnormal findings: Secondary | ICD-10-CM | POA: Diagnosis not present

## 2016-03-19 DIAGNOSIS — Z113 Encounter for screening for infections with a predominantly sexual mode of transmission: Secondary | ICD-10-CM

## 2016-03-19 NOTE — Progress Notes (Signed)
[name], female   DOB: Mar 22, 1981, 35 y.o.   MRN: [id number]   Subjective:        Dorothy Sullivan is a 35 y.o. female here for a routine exam.  Current complaints: None.    Personal health questionnaire:  Is patient Ashkenazi Jewish, have a family history of breast and/or ovarian cancer: no Is there a family history of uterine cancer diagnosed at age < 71, gastrointestinal cancer, urinary tract cancer, family member who is a Personnel officer syndrome-associated carrier: no Is the patient overweight and hypertensive, family history of diabetes, personal history of gestational diabetes, preeclampsia or PCOS: no Is patient over 38, have PCOS,  family history of premature CHD under age 67, diabetes, smoke, have hypertension or peripheral artery disease:  no At any time, has a partner hit, kicked or otherwise hurt or frightened you?: no Over the past 2 weeks, have you felt down, depressed or hopeless?: no Over the past 2 weeks, have you felt little interest or pleasure in doing things?:no   Gynecologic History Patient's last menstrual period was 03/04/2016 (exact date). Contraception: tubal ligation Last Pap: 2016. Results were: normal Last mammogram: n/a. Results were: n/a  Obstetric History OB History  No data available    Past Medical History:  Diagnosis Date  . Benign essential hypertension   . Dysuria   . Gastritis   . Insomnia   . Malaise and fatigue   . Malignant essential hypertension   . Morbid obesity (HCC)   . Panic disorder   . Vitamin D deficiency     Past Surgical History:  Procedure Laterality Date  . CESAREAN SECTION    . TONSILLECTOMY    . TUBAL LIGATION       Current Outpatient Prescriptions:  .  ALPRAZolam (XANAX) 0.5 MG tablet, Take 0.5 mg by mouth 2 (two) times daily as needed for anxiety., Disp: , Rfl:  .  Butalbital-APAP-Caffeine 50-300-40 MG CAPS, take 1-2 capsules by mouth every 6 hours if needed NOT TO EXCEED 4 CAPSULES PER 24  HOURS, Disp: , Rfl: 0 .  ibuprofen (ADVIL,MOTRIN) 800 MG tablet, Take 1 tablet (800 mg total) by mouth every 8 (eight) hours as needed. For headache and body aches. (Patient not taking: Reported on 03/19/2016), Disp: 21 tablet, Rfl: 0 .  ibuprofen (ADVIL,MOTRIN) 800 MG tablet, Take 1 tablet (800 mg total) by mouth every 8 (eight) hours as needed. (Patient not taking: Reported on 03/19/2016), Disp: 30 tablet, Rfl: 5 .  pantoprazole (PROTONIX) 40 MG tablet, Take 40 mg by mouth daily., Disp: , Rfl:  .  SUMAtriptan (IMITREX) 50 MG tablet, Take 1 tablet (50 mg total) by mouth every 2 (two) hours as needed for migraine. May repeat in 2 hours if headache persists or recurs. (Patient not taking: Reported on 03/19/2016), Disp: 10 tablet, Rfl: 0 .  zolpidem (AMBIEN) 10 MG tablet, take 1 tablet by mouth at bedtime if needed, Disp: , Rfl: 0 No Known Allergies  Social History  Substance Use Topics  . Smoking status: Never Smoker  . Smokeless tobacco: Never Used  . Alcohol use 0.0 oz/week     Comment: occasional    Family History  Problem Relation Age of Onset  . Aneurysm Father   . Migraines Sister   . Heart disease Paternal Grandfather       Review of Systems  Constitutional: negative for fatigue and weight loss Respiratory: negative for cough and wheezing Cardiovascular: negative for chest pain, fatigue and palpitations  Gastrointestinal: negative for abdominal pain and change in bowel habits Musculoskeletal:negative for myalgias Neurological: negative for gait problems and tremors Behavioral/Psych: negative for abusive relationship, depression Endocrine: negative for temperature intolerance   Genitourinary:negative for abnormal menstrual periods, genital lesions, hot flashes, sexual problems and vaginal discharge Integument/breast: negative for breast lump, breast tenderness, nipple discharge and skin lesion(s)    Objective:       BP (!) 152/97   Pulse 88   Temp 98.9 F (37.2 C)   Wt (!)  341 lb 9.6 oz (154.9 kg)   LMP 03/04/2016 (Exact Date)   BMI 56.85 kg/m  General:   alert  Skin:   no rash or abnormalities  Lungs:   clear to auscultation bilaterally  Heart:   regular rate and rhythm, S1, S2 normal, no murmur, click, rub or gallop  Breasts:   normal without suspicious masses, skin or nipple changes or axillary nodes  Abdomen:  normal findings: no organomegaly, soft, non-tender and no hernia  Pelvis:  External genitalia: normal general appearance Urinary system: urethral meatus normal and bladder without fullness, nontender Vaginal: normal without tenderness, induration or masses Cervix: normal appearance Adnexa: normal bimanual exam Uterus: anteverted and non-tender, normal size   Lab Review Urine pregnancy test Labs reviewed yes Radiologic studies reviewed no    Assessment:    Healthy female exam.    Obesity  Hypertension: uncontrolled  Plan:   F/U PCP for elevated blood pressure: appointment scheduled.    Lifestyle modifications and weight management program for weight loss and maintenance recommended  Education reviewed: calcium supplements, low fat, low cholesterol diet, safe sex/STD prevention, self breast exams and weight bearing exercise. Contraception: tubal ligation. Follow up in: 1 year.   No orders of the defined types were placed in this encounter.  No orders of the defined types were placed in this encounter.

## 2016-03-20 LAB — HEPATITIS B SURFACE ANTIGEN: HEP B S AG: NEGATIVE

## 2016-03-20 LAB — RPR: RPR Ser Ql: NONREACTIVE

## 2016-03-20 LAB — HEPATITIS C ANTIBODY: Hep C Virus Ab: <0.1 s/co ratio (ref 0.0–0.9)

## 2016-03-20 LAB — HIV ANTIBODY (ROUTINE TESTING W REFLEX): HIV Screen 4th Generation wRfx: NONREACTIVE

## 2016-03-21 LAB — PAP IG AND HPV HIGH-RISK
HPV, HIGH-RISK: NEGATIVE
PAP Smear Comment: 0

## 2016-03-22 ENCOUNTER — Other Ambulatory Visit: Payer: Self-pay | Admitting: Obstetrics

## 2016-03-22 DIAGNOSIS — N760 Acute vaginitis: Principal | ICD-10-CM

## 2016-03-22 DIAGNOSIS — B9689 Other specified bacterial agents as the cause of diseases classified elsewhere: Secondary | ICD-10-CM

## 2016-03-22 DIAGNOSIS — A5901 Trichomonal vulvovaginitis: Secondary | ICD-10-CM

## 2016-03-22 LAB — NUSWAB VG+, CANDIDA 6SP
ATOPOBIUM VAGINAE: HIGH {score} — AB
BVAB 2: HIGH {score} — AB
CANDIDA KRUSEI, NAA: NEGATIVE
Candida albicans, NAA: NEGATIVE
Candida glabrata, NAA: NEGATIVE
Candida lusitaniae, NAA: NEGATIVE
Candida parapsilosis, NAA: NEGATIVE
Candida tropicalis, NAA: NEGATIVE
Chlamydia trachomatis, NAA: NEGATIVE
Megasphaera 1: HIGH Score — AB
Neisseria gonorrhoeae, NAA: NEGATIVE
TRICH VAG BY NAA: POSITIVE — AB

## 2016-03-22 MED ORDER — TINIDAZOLE 500 MG PO TABS: 2.0000 g | ORAL_TABLET | Freq: Every day | ORAL | 1 refills | Status: DC

## 2016-03-25 ENCOUNTER — Telehealth: Payer: Self-pay | Admitting: Obstetrics

## 2016-03-25 NOTE — Telephone Encounter (Signed)
Pt called wondering why she was prescribed a medication, Tindamax. Please advise.

## 2016-03-25 NOTE — Telephone Encounter (Signed)
Left vm message for pt

## 2016-03-26 ENCOUNTER — Other Ambulatory Visit: Payer: 59

## 2017-02-25 ENCOUNTER — Ambulatory Visit (INDEPENDENT_AMBULATORY_CARE_PROVIDER_SITE_OTHER): Payer: Medicaid Other | Admitting: Obstetrics

## 2017-02-25 ENCOUNTER — Other Ambulatory Visit (HOSPITAL_COMMUNITY)
Admission: RE | Admit: 2017-02-25 | Discharge: 2017-02-25 | Disposition: A | Discharge status: Home / Self Care | Payer: Medicaid Other | Source: Ambulatory Visit | Attending: Obstetrics | Admitting: Obstetrics

## 2017-02-25 ENCOUNTER — Other Ambulatory Visit: Payer: Self-pay | Admitting: Obstetrics

## 2017-02-25 ENCOUNTER — Encounter: Payer: Self-pay | Admitting: Obstetrics

## 2017-02-25 DIAGNOSIS — N898 Other specified noninflammatory disorders of vagina: Secondary | ICD-10-CM

## 2017-02-25 DIAGNOSIS — Z0000 Encounter for general adult medical examination without abnormal findings: Secondary | ICD-10-CM

## 2017-02-25 DIAGNOSIS — B9689 Other specified bacterial agents as the cause of diseases classified elsewhere: Secondary | ICD-10-CM | POA: Diagnosis not present

## 2017-02-25 DIAGNOSIS — Z113 Encounter for screening for infections with a predominantly sexual mode of transmission: Secondary | ICD-10-CM

## 2017-02-25 DIAGNOSIS — Z01419 Encounter for gynecological examination (general) (routine) without abnormal findings: Secondary | ICD-10-CM

## 2017-02-25 NOTE — Progress Notes (Signed)
Subjective:        Dorothy Sullivan is a 36 y.o. female here for a routine exam.  Current complaints: None.    Personal health questionnaire:  Is patient Ashkenazi Jewish, have a family history of breast and/or ovarian cancer: no Is there a family history of uterine cancer diagnosed at age < 450, gastrointestinal cancer, urinary tract cancer, family member who is a Personnel officerLynch syndrome-associated carrier: no Is the patient overweight and hypertensive, family history of diabetes, personal history of gestational diabetes, preeclampsia or PCOS: no Is patient over 4855, have PCOS,  family history of premature CHD under age 38, diabetes, smoke, have hypertension or peripheral artery disease:  no At any time, has a partner hit, kicked or otherwise hurt or frightened you?: no Over the past 2 weeks, have you felt down, depressed or hopeless?: no Over the past 2 weeks, have you felt little interest or pleasure in doing things?:no   Gynecologic History No LMP recorded. Contraception: tubal ligation Last Pap: 2017. Results were: normal Last mammogram: n/a. Results were: n/a  Obstetric History OB History  Gravida Para Term Preterm AB Living  1 1 1     1   SAB TAB Ectopic Multiple Live Births          1    # Outcome Date GA Lbr Len/2nd Weight Sex Delivery Anes PTL Lv  1 Term 02/19/99    F CS-LTranv   LIV      Past Medical History:  Diagnosis Date  . Benign essential hypertension   . Dysuria   . Gastritis   . Insomnia   . Malaise and fatigue   . Malignant essential hypertension   . Morbid obesity (HCC)   . Panic disorder   . Vitamin D deficiency     Past Surgical History:  Procedure Laterality Date  . CESAREAN SECTION    . TONSILLECTOMY    . TUBAL LIGATION       Current Outpatient Prescriptions:  .  ALPRAZolam (XANAX) 0.5 MG tablet, Take 0.5 mg by mouth 2 (two) times daily as needed for anxiety., Disp: , Rfl:  .  cholecalciferol (VITAMIN D) 1000 units tablet, Take 1,000 Units by  mouth daily., Disp: , Rfl:  .  zolpidem (AMBIEN) 10 MG tablet, take 1 tablet by mouth at bedtime if needed, Disp: , Rfl: 0 .  Butalbital-APAP-Caffeine 50-300-40 MG CAPS, take 1-2 capsules by mouth every 6 hours if needed NOT TO EXCEED 4 CAPSULES PER 24 HOURS, Disp: , Rfl: 0 .  ibuprofen (ADVIL,MOTRIN) 800 MG tablet, Take 1 tablet (800 mg total) by mouth every 8 (eight) hours as needed. For headache and body aches. (Patient not taking: Reported on 03/19/2016), Disp: 21 tablet, Rfl: 0 .  ibuprofen (ADVIL,MOTRIN) 800 MG tablet, Take 1 tablet (800 mg total) by mouth every 8 (eight) hours as needed. (Patient not taking: Reported on 03/19/2016), Disp: 30 tablet, Rfl: 5 .  pantoprazole (PROTONIX) 40 MG tablet, Take 40 mg by mouth daily., Disp: , Rfl:  .  SUMAtriptan (IMITREX) 50 MG tablet, Take 1 tablet (50 mg total) by mouth every 2 (two) hours as needed for migraine. May repeat in 2 hours if headache persists or recurs. (Patient not taking: Reported on 03/19/2016), Disp: 10 tablet, Rfl: 0 .  tinidazole (TINDAMAX) 500 MG tablet, Take 4 tablets (2,000 mg total) by mouth daily. (Patient not taking: Reported on 02/25/2017), Disp: 8 tablet, Rfl: 1 No Known Allergies  Social History  Substance Use Topics  .  Smoking status: Never Smoker  . Smokeless tobacco: Never Used  . Alcohol use 0.0 oz/week     Comment: occasional    Family History  Problem Relation Age of Onset  . Aneurysm Father   . Migraines Sister   . Heart disease Paternal Grandfather       Review of Systems  Constitutional: negative for fatigue and weight loss Respiratory: negative for cough and wheezing Cardiovascular: negative for chest pain, fatigue and palpitations Gastrointestinal: negative for abdominal pain and change in bowel habits Musculoskeletal:negative for myalgias Neurological: negative for gait problems and tremors Behavioral/Psych: negative for abusive relationship, depression Endocrine: negative for temperature intolerance     Genitourinary:negative for abnormal menstrual periods, genital lesions, hot flashes, sexual problems and vaginal discharge Integument/breast: negative for breast lump, breast tenderness, nipple discharge and skin lesion(s)    Objective:       There were no vitals taken for this visit. General:   alert  Skin:   no rash or abnormalities  Lungs:   clear to auscultation bilaterally  Heart:   regular rate and rhythm, S1, S2 normal, no murmur, click, rub or gallop  Breasts:   normal without suspicious masses, skin or nipple changes or axillary nodes  Abdomen:  normal findings: no organomegaly, soft, non-tender and no hernia  Pelvis:  External genitalia: normal general appearance Urinary system: urethral meatus normal and bladder without fullness, nontender Vaginal: normal without tenderness, induration or masses Cervix: normal appearance Adnexa: normal bimanual exam Uterus: anteverted and non-tender, normal size   Lab Review Urine pregnancy test Labs reviewed yes Radiologic studies reviewed no  50% of 20 min visit spent on counseling and coordination of care.    Assessment:    Healthy female exam.    Morbid Obesity   Plan:    Education reviewed: calcium supplements, depression evaluation, low fat, low cholesterol diet, safe sex/STD prevention, self breast exams and weight bearing exercise. Follow up in: 1 year.   Meds ordered this encounter  Medications  . cholecalciferol (VITAMIN D) 1000 units tablet    Sig: Take 1,000 Units by mouth daily.   No orders of the defined types were placed in this encounter.    [name], female   DOB: 11/25/1980, 36 y.o.   MRN: [id number]   [name], female   DOB: July 13, 1981, 36 y.o.   MRN: [id number]

## 2017-02-25 NOTE — Progress Notes (Signed)
Patient is in the office for annual exam, last pap 03-19-16.

## 2017-02-27 LAB — CERVICOVAGINAL ANCILLARY ONLY
BACTERIAL VAGINITIS: POSITIVE — AB
Candida vaginitis: NEGATIVE
Chlamydia: NEGATIVE
Neisseria Gonorrhea: NEGATIVE
Trichomonas: POSITIVE — AB

## 2017-02-27 LAB — HEPATITIS B SURFACE ANTIGEN: HEP B S AG: NEGATIVE

## 2017-02-27 LAB — CYTOLOGY - PAP
DIAGNOSIS: NEGATIVE
HPV (WINDOPATH): NOT DETECTED

## 2017-02-27 LAB — HEPATITIS C ANTIBODY: Hep C Virus Ab: <0.1 s/co ratio (ref 0.0–0.9)

## 2017-02-27 LAB — HIV ANTIBODY (ROUTINE TESTING W REFLEX): HIV SCREEN 4TH GENERATION: NONREACTIVE

## 2017-02-27 LAB — RPR: RPR Ser Ql: NONREACTIVE

## 2017-02-28 ENCOUNTER — Other Ambulatory Visit: Payer: Self-pay | Admitting: Obstetrics

## 2017-02-28 DIAGNOSIS — N760 Acute vaginitis: Principal | ICD-10-CM

## 2017-02-28 DIAGNOSIS — A5901 Trichomonal vulvovaginitis: Secondary | ICD-10-CM

## 2017-02-28 DIAGNOSIS — B9689 Other specified bacterial agents as the cause of diseases classified elsewhere: Secondary | ICD-10-CM

## 2017-02-28 MED ORDER — TINIDAZOLE 500 MG PO TABS: 2.0000 g | ORAL_TABLET | Freq: Every day | ORAL | 0 refills | Status: DC

## 2017-03-12 ENCOUNTER — Telehealth: Payer: Self-pay

## 2017-03-12 NOTE — Telephone Encounter (Signed)
Returned call and advised of lab results, treatment [facility], patient agreed.

## 2017-03-19 ENCOUNTER — Ambulatory Visit: Payer: 59 | Admitting: Obstetrics

## 2017-03-25 ENCOUNTER — Ambulatory Visit: Payer: Self-pay | Admitting: Obstetrics

## 2019-11-02 ENCOUNTER — Other Ambulatory Visit: Payer: Self-pay

## 2019-11-02 ENCOUNTER — Ambulatory Visit: Payer: Medicaid Other | Admitting: Obstetrics

## 2020-09-15 ENCOUNTER — Ambulatory Visit: Payer: Medicaid Other | Admitting: Obstetrics

## 2020-10-19 ENCOUNTER — Ambulatory Visit: Payer: Medicaid Other | Admitting: Obstetrics

## 2021-02-10 ENCOUNTER — Ambulatory Visit
Admission: EM | Admit: 2021-02-10 | Discharge: 2021-02-10 | Disposition: A | Discharge status: Home / Self Care | Payer: Medicaid Other | Attending: Emergency Medicine | Admitting: Emergency Medicine

## 2021-02-10 ENCOUNTER — Other Ambulatory Visit: Payer: Self-pay

## 2021-02-10 DIAGNOSIS — L03011 Cellulitis of right finger: Secondary | ICD-10-CM

## 2021-02-10 MED ORDER — MUPIROCIN 2 % EX OINT: 1.0000 "application " | TOPICAL_OINTMENT | Freq: Two times a day (BID) | CUTANEOUS | 0 refills | Status: DC

## 2021-02-10 MED ORDER — CEPHALEXIN 500 MG PO CAPS: 500.0000 mg | ORAL_CAPSULE | Freq: Four times a day (QID) | ORAL | 0 refills | Status: AC

## 2021-02-10 NOTE — Discharge Instructions (Addendum)
Begin Keflex Continue warm soaks twice daily, dry well and apply Bactroban/antibiotic ointment around nail bed Gentle massage to express further drainage Tylenol and ibuprofen for pain Follow-up if not improving or worsening

## 2021-02-10 NOTE — ED Triage Notes (Signed)
Two day h/o red swollen right 2nd digit.  Finger is painful to the touch. Pt reports that she may have pulled a hangnail to onset the sxs. No injuries noted. Has tried peroxide and warm soaks without relief.

## 2021-02-10 NOTE — ED Provider Notes (Signed)
[facility]    CSN: [id number] Arrival date & time: 02/10/21  0809      History   Chief Complaint Chief Complaint  Patient presents with   paronychia    HPI Dorothy Sullivan is a 40 y.o. female history of hypertension presenting today for evaluation of possible finger infection.  Reports increased pain redness and swelling to right index finger over the past 2 days.  He started to develop a slight pus pocket.  Believes that she picked a hangnail which triggered this.  Denies fevers.  Developing increased discomfort with bending finger.  HPI  Past Medical History:  Diagnosis Date   Benign essential hypertension    Dysuria    Gastritis    Insomnia    Malaise and fatigue    Malignant essential hypertension    Morbid obesity (HCC)    Panic disorder    Vitamin D deficiency     Patient Active Problem List   Diagnosis Date Noted   Fatigue 09/11/2015   Sleep talking 09/11/2015   Snoring 09/11/2015   Sleep related headaches 09/11/2015   Morbid obesity due to excess calories (HCC) 09/11/2015   BV (bacterial vaginosis) 12/14/2012    Past Surgical History:  Procedure Laterality Date   CESAREAN SECTION     TONSILLECTOMY     TUBAL LIGATION      OB History     Gravida  1   Para  1   Term  1   Preterm      AB      Living  1      SAB      IAB      Ectopic      Multiple      Live Births  1            Home Medications    Prior to Admission medications   Medication Sig Start Date End Date Taking? Authorizing Provider  cephALEXin (KEFLEX) 500 MG capsule Take 1 capsule (500 mg total) by mouth 4 (four) times daily for 7 days. 02/10/21 02/17/21 Yes Masiah Lewing C, PA-C  mupirocin ointment (BACTROBAN) 2 % Apply 1 application topically 2 (two) times daily. 02/10/21  Yes Tahsin Benyo C, PA-C  ALPRAZolam (XANAX) 0.5 MG tablet Take 0.5 mg by mouth 2 (two) times daily as needed for anxiety.    [provider]   Butalbital-APAP-Caffeine 50-300-40 MG CAPS take 1-2 capsules by mouth every 6 hours if needed NOT TO EXCEED 4 CAPSULES PER 24 HOURS 07/24/15   [provider]  cholecalciferol (VITAMIN D) 1000 units tablet Take 1,000 Units by mouth daily.    [provider]  ibuprofen (ADVIL,MOTRIN) 800 MG tablet Take 1 tablet (800 mg total) by mouth every 8 (eight) hours as needed. For headache and body aches. Patient not taking: Reported on 03/19/2016 09/19/13   Schorr, Roma Kayser, FNP  ibuprofen (ADVIL,MOTRIN) 800 MG tablet Take 1 tablet (800 mg total) by mouth every 8 (eight) hours as needed. Patient not taking: Reported on 03/19/2016 04/12/15   Brock Bad, MD  pantoprazole (PROTONIX) 40 MG tablet Take 40 mg by mouth daily.    [provider]  SUMAtriptan (IMITREX) 50 MG tablet Take 1 tablet (50 mg total) by mouth every 2 (two) hours as needed for migraine. May repeat in 2 hours if headache persists or recurs. Patient not taking: Reported on 03/19/2016 09/11/15   Dohmeier, Porfirio Mylar, MD  tinidazole (TINDAMAX) 500 MG tablet Take 4 tablets (  2,000 mg total) by mouth daily. 02/28/17   Brock Bad, MD  zolpidem (AMBIEN) 10 MG tablet take 1 tablet by mouth at bedtime if needed 07/24/15   [provider]    Family History Family History  Problem Relation Age of Onset   Aneurysm Father    Migraines Sister    Heart disease Paternal Grandfather     Social History Social History   Tobacco Use   Smoking status: Never   Smokeless tobacco: Never  Substance Use Topics   Alcohol use: Yes    Alcohol/week: 0.0 standard drinks    Comment: occasional   Drug use: No     Allergies   Patient has no known allergies.   Review of Systems Review of Systems  Constitutional:  Negative for fatigue and fever.  HENT:  Negative for mouth sores.   Eyes:  Negative for visual disturbance.  Respiratory:  Negative for shortness of breath.   Cardiovascular:  Negative for chest pain.   Gastrointestinal:  Negative for abdominal pain, nausea and vomiting.  Genitourinary:  Negative for genital sores.  Musculoskeletal:  Positive for arthralgias and joint swelling.  Skin:  Positive for color change. Negative for rash and wound.  Neurological:  Negative for dizziness, weakness, light-headedness and headaches.    Physical Exam Triage Vital Signs ED Triage Vitals  Enc Vitals Group     BP      Pulse      Resp      Temp      Temp src      SpO2      Weight      Height      Head Circumference      Peak Flow      Pain Score      Pain Loc      Pain Edu?      Excl. in GC?    No data found.  Updated Vital Signs BP 137/84 (BP Location: Left Arm)   Pulse 80   Temp 98 F (36.7 C) (Oral)   Resp 18   SpO2 97%   Visual Acuity Right Eye Distance:   Left Eye Distance:   Bilateral Distance:    Right Eye Near:   Left Eye Near:    Bilateral Near:     Physical Exam Vitals and nursing note reviewed.  Constitutional:      Appearance: She is well-developed.     Comments: No acute distress  HENT:     Head: Normocephalic and atraumatic.     Nose: Nose normal.  Eyes:     Conjunctiva/sclera: Conjunctivae normal.  Cardiovascular:     Rate and Rhythm: Normal rate.  Pulmonary:     Effort: Pulmonary effort is normal. No respiratory distress.  Abdominal:     General: There is no distension.  Musculoskeletal:        General: Normal range of motion.     Cervical back: Neck supple.  Skin:    General: Skin is warm and dry.     Comments: Right index finger with erythema and swelling around nailbed, most prominent to lateral aspect with associated tenderness, very small pus pocket noted to lateral nail fold, nontender to distal finger pad  Neurological:     Mental Status: She is alert and oriented to person, place, and time.     UC Treatments / Results  Labs (all labs ordered are listed, but only abnormal results are displayed) Labs Reviewed - No data to  display  EKG   Radiology No results found.  Procedures Procedures (including critical care time)  Nail fold cleansed with alcohol, 11 blade scalpel used to gently open along nail fold into small pus pocket with small amount of drainage obtained, did attempt to go more towards proximal nail fold, but no pus obtained and opted to stop  Medications Ordered in UC Medications - No data to display  Initial Impression / Assessment and Plan / UC Course  I have reviewed the triage vital signs and the nursing notes.  Pertinent labs & imaging results that were available during my care of the patient were reviewed by me and considered in my medical decision making (see chart for details).     Paronychia-treating with Keflex, warm soaks and topical antibiotic ointment, anti-inflammatories for pain, monitor for gradual resolution.  No signs of felon at this time.  Advised if extending to distal finger pad to go to emergency room  Discussed strict return precautions. Patient verbalized understanding and is agreeable with plan.  Final Clinical Impressions(s) / UC Diagnoses   Final diagnoses:  Paronychia of right index finger     Discharge Instructions      Begin Keflex Continue warm soaks twice daily, dry well and apply Bactroban/antibiotic ointment around nail bed Gentle massage to express further drainage Tylenol and ibuprofen for pain Follow-up if not improving or worsening     ED Prescriptions     Medication Sig Dispense Auth. Provider   cephALEXin (KEFLEX) 500 MG capsule Take 1 capsule (500 mg total) by mouth 4 (four) times daily for 7 days. 28 capsule Mozella Rexrode C, PA-C   mupirocin ointment (BACTROBAN) 2 % Apply 1 application topically 2 (two) times daily. 30 g Shilynn Hoch, [place] C, PA-C      PDMP not reviewed this encounter.   Lew Dawes, [place] 02/10/21 [phone]

## 2021-05-25 ENCOUNTER — Ambulatory Visit (INDEPENDENT_AMBULATORY_CARE_PROVIDER_SITE_OTHER): Payer: 59 | Admitting: Obstetrics

## 2021-05-25 ENCOUNTER — Encounter: Payer: Self-pay | Admitting: Obstetrics

## 2021-05-25 ENCOUNTER — Other Ambulatory Visit (HOSPITAL_COMMUNITY)
Admission: RE | Admit: 2021-05-25 | Discharge: 2021-05-25 | Disposition: A | Discharge status: Home / Self Care | Payer: 59 | Source: Ambulatory Visit | Attending: Obstetrics | Admitting: Obstetrics

## 2021-05-25 ENCOUNTER — Other Ambulatory Visit: Payer: Self-pay

## 2021-05-25 VITALS — BP 130/85 | HR 93 | Ht 65.0 in | Wt 373.0 lb

## 2021-05-25 DIAGNOSIS — Z01419 Encounter for gynecological examination (general) (routine) without abnormal findings: Secondary | ICD-10-CM

## 2021-05-25 DIAGNOSIS — Z1239 Encounter for other screening for malignant neoplasm of breast: Secondary | ICD-10-CM | POA: Diagnosis not present

## 2021-05-25 DIAGNOSIS — Z6841 Body Mass Index (BMI) 40.0 and over, adult: Secondary | ICD-10-CM | POA: Diagnosis not present

## 2021-05-25 DIAGNOSIS — N898 Other specified noninflammatory disorders of vagina: Secondary | ICD-10-CM | POA: Diagnosis not present

## 2021-05-25 DIAGNOSIS — Z113 Encounter for screening for infections with a predominantly sexual mode of transmission: Secondary | ICD-10-CM

## 2021-05-25 DIAGNOSIS — Z0000 Encounter for general adult medical examination without abnormal findings: Secondary | ICD-10-CM | POA: Diagnosis not present

## 2021-05-25 MED ORDER — PANTOPRAZOLE SODIUM 40 MG PO TBEC: 40.0000 mg | DELAYED_RELEASE_TABLET | Freq: Every day | ORAL | 2 refills | Status: DC

## 2021-05-25 MED ORDER — DIPHENHYDRAMINE HCL 50 MG PO TABS: 50.0000 mg | ORAL_TABLET | Freq: Three times a day (TID) | ORAL | 0 refills | Status: DC | PRN

## 2021-05-25 NOTE — Progress Notes (Signed)
GYN presents for AEX\PAP/STD screening. She have never had a Mammogram

## 2021-05-25 NOTE — Progress Notes (Signed)
Subjective:        Dorothy Sullivan is a 40 y.o. female here for a routine exam.  Current complaints: Vaginal discharge.    Personal health questionnaire:  Is patient Ashkenazi Jewish, have a family history of breast and/or ovarian cancer: no Is there a family history of uterine cancer diagnosed at age < 40, gastrointestinal cancer, urinary tract cancer, family member who is a Personnel officer syndrome-associated carrier: no Is the patient overweight and hypertensive, family history of diabetes, personal history of gestational diabetes, preeclampsia or PCOS: no Is patient over 15, have PCOS,  family history of premature CHD under age 54, diabetes, smoke, have hypertension or peripheral artery disease:  no At any time, has a partner hit, kicked or otherwise hurt or frightened you?: no Over the past 2 weeks, have you felt down, depressed or hopeless?: no Over the past 2 weeks, have you felt little interest or pleasure in doing things?:no   Gynecologic History Patient's last menstrual period was 05/11/2021 (approximate). Contraception: tubal ligation Last Pap: 2018. Results were: normal Last mammogram: none. Results were: none  Obstetric History OB History  Gravida Para Term Preterm AB Living  1 1 1     1   SAB IAB Ectopic Multiple Live Births          1    # Outcome Date GA Lbr Len/2nd Weight Sex Delivery Anes PTL Lv  1 Term 02/19/99    F CS-LTranv   LIV    Past Medical History:  Diagnosis Date   Benign essential hypertension    Dysuria    Gastritis    Insomnia    Malaise and fatigue    Malignant essential hypertension    Morbid obesity (HCC)    Panic disorder    Vitamin D deficiency     Past Surgical History:  Procedure Laterality Date   CESAREAN SECTION     TONSILLECTOMY     TUBAL LIGATION       Current Outpatient Medications:    ALPRAZolam (XANAX) 0.5 MG tablet, Take 0.5 mg by mouth 2 (two) times daily as needed for anxiety. (Patient not taking: Reported on  05/25/2021), Disp: , Rfl:    Butalbital-APAP-Caffeine 50-300-40 MG CAPS, take 1-2 capsules by mouth every 6 hours if needed NOT TO EXCEED 4 CAPSULES PER 24 HOURS, Disp: , Rfl: 0   cholecalciferol (VITAMIN D) 1000 units tablet, Take 1,000 Units by mouth daily. (Patient not taking: Reported on 05/25/2021), Disp: , Rfl:    ibuprofen (ADVIL,MOTRIN) 800 MG tablet, Take 1 tablet (800 mg total) by mouth every 8 (eight) hours as needed. For headache and body aches. (Patient not taking: Reported on 03/19/2016), Disp: 21 tablet, Rfl: 0   ibuprofen (ADVIL,MOTRIN) 800 MG tablet, Take 1 tablet (800 mg total) by mouth every 8 (eight) hours as needed. (Patient not taking: Reported on 03/19/2016), Disp: 30 tablet, Rfl: 5   mupirocin ointment (BACTROBAN) 2 %, Apply 1 application topically 2 (two) times daily. (Patient not taking: Reported on 05/25/2021), Disp: 30 g, Rfl: 0   pantoprazole (PROTONIX) 40 MG tablet, Take 40 mg by mouth daily. (Patient not taking: Reported on 05/25/2021), Disp: , Rfl:    SUMAtriptan (IMITREX) 50 MG tablet, Take 1 tablet (50 mg total) by mouth every 2 (two) hours as needed for migraine. May repeat in 2 hours if headache persists or recurs. (Patient not taking: Reported on 03/19/2016), Disp: 10 tablet, Rfl: 0   tinidazole (TINDAMAX) 500 MG tablet, Take 4 tablets (2,000  mg total) by mouth daily., Disp: 8 tablet, Rfl: 0   zolpidem (AMBIEN) 10 MG tablet, take 1 tablet by mouth at bedtime if needed, Disp: , Rfl: 0 No Known Allergies  Social History   Tobacco Use   Smoking status: Never   Smokeless tobacco: Never  Substance Use Topics   Alcohol use: Yes    Alcohol/week: 0.0 standard drinks    Comment: occasional    Family History  Problem Relation Age of Onset   Aneurysm Father    Migraines Sister    Heart disease Paternal Grandfather       Review of Systems  Constitutional: negative for fatigue and weight loss Respiratory: negative for cough and wheezing Cardiovascular: negative for  chest pain, fatigue and palpitations Gastrointestinal: negative for abdominal pain and change in bowel habits Musculoskeletal:negative for myalgias Neurological: negative for gait problems and tremors Behavioral/Psych: negative for abusive relationship, depression Endocrine: negative for temperature intolerance    Genitourinary:negative for abnormal menstrual periods, genital lesions, hot flashes, sexual problems and vaginal discharge Integument/breast: negative for breast lump, breast tenderness, nipple discharge and skin lesion(s)    Objective:       BP 130/85 (BP Location: Left Arm, Cuff Size: Large)   Pulse 93   Ht 5\' 5"  (1.651 m)   Wt (!) 373 lb (169.2 kg)   LMP 05/11/2021 (Approximate)   BMI 62.07 kg/m  General:   Alert and no distress  Skin:   no rash or abnormalities  Lungs:   clear to auscultation bilaterally  Heart:   regular rate and rhythm, S1, S2 normal, no murmur, click, rub or gallop  Breasts:   normal without suspicious masses, skin or nipple changes or axillary nodes  Abdomen:  normal findings: no organomegaly, soft, non-tender and no hernia  Pelvis:  External genitalia: normal general appearance Urinary system: urethral meatus normal and bladder without fullness, nontender Vaginal: normal without tenderness, induration or masses Cervix: normal appearance Adnexa: normal bimanual exam Uterus: anteverted and non-tender, normal size   Lab Review Urine pregnancy test Labs reviewed yes Radiologic studies reviewed no  I have spent a total of 20 minutes of face-to-face time, excluding clinical staff time, reviewing notes and preparing to see patient, ordering tests and/or medications, and counseling the patient. 20  Assessment:    I have spent a total of 20 minutes of face-to-face time, excluding clinical staff time, reviewing notes and preparing to see patient, ordering tests and/or medications, and counseling the patient.    Plan:    Education reviewed:  calcium supplements, depression evaluation, low fat, low cholesterol diet, safe sex/STD prevention, self breast exams, and weight bearing exercise. Mammogram ordered. Follow up in: 1 year.   Meds ordered this encounter  Medications   DISCONTD: diphenhydrAMINE (BENADRYL) 50 MG tablet    Sig: Take 1 tablet (50 mg total) by mouth every 8 (eight) hours as needed for itching.    Dispense:  30 tablet    Refill:  0   DISCONTD: pantoprazole (PROTONIX) 40 MG tablet    Sig: Take 1 tablet (40 mg total) by mouth daily.    Dispense:  30 tablet    Refill:  2    Orders Placed This Encounter  Procedures   MM Digital Screening    Standing Status:   Future    Standing Expiration Date:   05/24/2022    Order Specific Question:   Reason for Exam (SYMPTOM  OR DIAGNOSIS REQUIRED)    Answer:   Screening  Order Specific Question:   Is the patient pregnant?    Answer:   No    Order Specific Question:   Preferred imaging location?    Answer:   [facility]   HIV Antibody (routine testing w rflx)   Hepatitis B surface antigen   RPR   Hepatitis C antibody   Hemoglobin A1c   TSH   Comprehensive metabolic panel   CBC with Differential/Platelet   Lipid Profile   Ambulatory referral to Internal Medicine    Referral Priority:   Routine    Referral Type:   Consultation    Referral Reason:   Specialty Services Required    Requested Specialty:   Internal Medicine    Number of Visits Requested:   1     Brock Bad, MD 05/25/2021 9:43 AM

## 2021-05-26 LAB — COMPREHENSIVE METABOLIC PANEL
ALT: 19 IU/L (ref 0–32)
AST: 17 IU/L (ref 0–40)
Albumin/Globulin Ratio: 1.6 (ref 1.2–2.2)
Albumin: 4.3 g/dL (ref 3.8–4.8)
Alkaline Phosphatase: 55 IU/L (ref 44–121)
BUN/Creatinine Ratio: 10 (ref 9–23)
BUN: 11 mg/dL (ref 6–24)
Bilirubin Total: 0.2 mg/dL (ref 0.0–1.2)
CO2: 20 mmol/L (ref 20–29)
Calcium: 9.7 mg/dL (ref 8.7–10.2)
Chloride: 99 mmol/L (ref 96–106)
Creatinine, Ser: 1.11 mg/dL — ABNORMAL HIGH (ref 0.57–1.00)
Globulin, Total: 2.7 g/dL (ref 1.5–4.5)
Glucose: 101 mg/dL — ABNORMAL HIGH (ref 70–99)
Potassium: 4.7 mmol/L (ref 3.5–5.2)
Sodium: 136 mmol/L (ref 134–144)
Total Protein: 7 g/dL (ref 6.0–8.5)
eGFR: 64 mL/min/{1.73_m2} (ref >59)

## 2021-05-26 LAB — TSH: TSH: 1.78 u[IU]/mL (ref 0.450–4.500)

## 2021-05-26 LAB — CBC WITH DIFFERENTIAL/PLATELET
Basophils Absolute: 0 10*3/uL (ref 0.0–0.2)
Basos: 1 %
EOS (ABSOLUTE): 0.3 10*3/uL (ref 0.0–0.4)
Eos: 5 %
Hematocrit: 37.6 % (ref 34.0–46.6)
Hemoglobin: 12.4 g/dL (ref 11.1–15.9)
Immature Grans (Abs): 0 10*3/uL (ref 0.0–0.1)
Immature Granulocytes: 0 %
Lymphocytes Absolute: 2 10*3/uL (ref 0.7–3.1)
Lymphs: 33 %
MCH: 30.2 pg (ref 26.6–33.0)
MCHC: 33 g/dL (ref 31.5–35.7)
MCV: 92 fL (ref 79–97)
Monocytes Absolute: 0.5 10*3/uL (ref 0.1–0.9)
Monocytes: 9 %
Neutrophils Absolute: 3.3 10*3/uL (ref 1.4–7.0)
Neutrophils: 52 %
Platelets: 314 10*3/uL (ref 150–450)
RBC: 4.1 x10E6/uL (ref 3.77–5.28)
RDW: 14.4 % (ref 11.7–15.4)
WBC: 6.1 10*3/uL (ref 3.4–10.8)

## 2021-05-26 LAB — HEPATITIS C ANTIBODY: Hep C Virus Ab: <0.1 s/co ratio (ref 0.0–0.9)

## 2021-05-26 LAB — LIPID PANEL
Chol/HDL Ratio: 3.8 ratio (ref 0.0–4.4)
Cholesterol, Total: 190 mg/dL (ref 100–199)
HDL: 50 mg/dL (ref >39)
LDL Chol Calc (NIH): 126 mg/dL — ABNORMAL HIGH (ref 0–99)
Triglycerides: 78 mg/dL (ref 0–149)
VLDL Cholesterol Cal: 14 mg/dL (ref 5–40)

## 2021-05-26 LAB — RPR: RPR Ser Ql: NONREACTIVE

## 2021-05-26 LAB — HIV ANTIBODY (ROUTINE TESTING W REFLEX): HIV Screen 4th Generation wRfx: NONREACTIVE

## 2021-05-26 LAB — HEMOGLOBIN A1C
Est. average glucose Bld gHb Est-mCnc: 134 mg/dL
Hgb A1c MFr Bld: 6.3 % — ABNORMAL HIGH (ref 4.8–5.6)

## 2021-05-26 LAB — HEPATITIS B SURFACE ANTIGEN: Hepatitis B Surface Ag: NEGATIVE

## 2021-05-28 LAB — CERVICOVAGINAL ANCILLARY ONLY
Bacterial Vaginitis (gardnerella): NEGATIVE
Candida Glabrata: NEGATIVE
Candida Vaginitis: NEGATIVE
Chlamydia: NEGATIVE
Comment: NEGATIVE
Comment: NEGATIVE
Comment: NEGATIVE
Comment: NEGATIVE
Comment: NEGATIVE
Comment: NORMAL
Neisseria Gonorrhea: NEGATIVE
Trichomonas: POSITIVE — AB

## 2021-05-29 ENCOUNTER — Other Ambulatory Visit: Payer: Self-pay | Admitting: Obstetrics

## 2021-05-29 DIAGNOSIS — A5901 Trichomonal vulvovaginitis: Secondary | ICD-10-CM

## 2021-05-29 DIAGNOSIS — R7303 Prediabetes: Secondary | ICD-10-CM

## 2021-05-29 LAB — CYTOLOGY - PAP
Adequacy: ABSENT
Comment: NEGATIVE
Diagnosis: NEGATIVE
High risk HPV: NEGATIVE

## 2021-05-29 MED ORDER — METRONIDAZOLE 500 MG PO TABS: 500.0000 mg | ORAL_TABLET | Freq: Two times a day (BID) | ORAL | 2 refills | Status: DC

## 2021-06-25 ENCOUNTER — Ambulatory Visit
Admission: EM | Admit: 2021-06-25 | Discharge: 2021-06-25 | Disposition: A | Discharge status: Home / Self Care | Payer: 59 | Attending: Emergency Medicine | Admitting: Emergency Medicine

## 2021-06-25 ENCOUNTER — Encounter: Payer: Self-pay | Admitting: Emergency Medicine

## 2021-06-25 ENCOUNTER — Other Ambulatory Visit: Payer: Self-pay

## 2021-06-25 DIAGNOSIS — R0981 Nasal congestion: Secondary | ICD-10-CM

## 2021-06-25 DIAGNOSIS — J069 Acute upper respiratory infection, unspecified: Secondary | ICD-10-CM

## 2021-06-25 LAB — POCT INFLUENZA A/B
Influenza A, POC: NEGATIVE
Influenza B, POC: NEGATIVE

## 2021-06-25 MED ORDER — PREDNISONE 10 MG PO TABS: ORAL_TABLET | ORAL | 0 refills | Status: AC

## 2021-06-25 NOTE — ED Triage Notes (Signed)
Pt here with sore throat, head congestion, and headache x 2 days.

## 2021-06-25 NOTE — ED Provider Notes (Signed)
CHIEF COMPLAINT:   Chief Complaint  Patient presents with   Nasal Congestion   Sore Throat   Headache     SUBJECTIVE/HPI:  HPI A very pleasant 40 y.o.Female presents today with sore throat, head/nasal congestion and headache that started 2 days ago. Patient does not report any shortness of breath, chest pain, palpitations, visual changes, weakness, tingling, nausea, vomiting, diarrhea, fever, chills.   has a past medical history of Benign essential hypertension, Dysuria, Gastritis, Insomnia, Malaise and fatigue, Malignant essential hypertension, Morbid obesity (HCC), Panic disorder, and Vitamin D deficiency.  ROS:  Review of Systems See Subjective/HPI Medications, Allergies and Problem List personally reviewed in Epic today OBJECTIVE:   Vitals:   06/25/21 1645  BP: (!) 176/86  Pulse: 94  Resp: 18  Temp: 98.2 F (36.8 C)  SpO2: 98%    Physical Exam   General: Appears well-developed and well-nourished. No acute distress.  HEENT Head: Normocephalic and atraumatic.   Ears: Hearing grossly intact, no drainage or visible deformity.  Nose: No nasal deviation.   Mouth/Throat: No stridor or tracheal deviation.  Non erythematous posterior pharynx noted with clear drainage present.  No white patchy exudate noted. Eyes: Conjunctivae and EOM are normal. No eye drainage or scleral icterus bilaterally.  Neck: Normal range of motion, neck is supple.  Cardiovascular: Normal rate. Regular rhythm; no murmurs, gallops, or rubs.  Pulm/Chest: No respiratory distress. Breath sounds normal bilaterally without wheezes, rhonchi, or rales.  Neurological: Alert and oriented to person, place, and time.  Skin: Skin is warm and dry.  No rashes, lesions, abrasions or bruising noted to skin.   Psychiatric: Normal mood, affect, behavior, and thought content.   Vital signs and nursing note reviewed.   Patient stable and cooperative with examination. PROCEDURES:    LABS/X-RAYS/EKG/MEDS:   Results  for orders placed or performed during the hospital encounter of 06/25/21  POCT Influenza A/B  Result Value Ref Range   Influenza A, POC Negative Negative   Influenza B, POC Negative Negative    MEDICAL DECISION MAKING:   Patient presents with sore throat, head/nasal congestion and headache that started 2 days ago. Patient does not report any shortness of breath, chest pain, palpitations, visual changes, weakness, tingling, nausea, vomiting, diarrhea, fever, chills.. Influenza negative.  Likely viral URI.  Rx'd a low-dose prednisone taper to the patient's preferred pharmacy to help with congestion. Advised rest, fluids, Tylenol, ibuprofen, Mucinex and Sudafed.  Follow-up with PCP if not improving over the next 5 to 7 days.  Return for any new high fever not improved with medications, chest pain, difficulty breathing, nonstop vomiting or coughing up blood.   Patient verbalized understanding and agreed with treatment plan.  Patient stable upon discharge. ASSESSMENT/PLAN:  1. Viral URI - predniSONE (DELTASONE) 10 MG tablet; Take 3 tablets (30 mg total) by mouth daily with breakfast for 2 days, THEN 2 tablets (20 mg total) daily with breakfast for 2 days, THEN 1 tablet (10 mg total) daily with breakfast for 2 days.  Dispense: 12 tablet; Refill: 0  2. Nasal congestion - predniSONE (DELTASONE) 10 MG tablet; Take 3 tablets (30 mg total) by mouth daily with breakfast for 2 days, THEN 2 tablets (20 mg total) daily with breakfast for 2 days, THEN 1 tablet (10 mg total) daily with breakfast for 2 days.  Dispense: 12 tablet; Refill: 0 Instructions about new medications and side effects provided.  Plan:   Discharge Instructions      Take prednisone as prescribed  For  most people this is a self-limiting process and can take anywhere from 7 - 10 days to start feeling better. A cough can last up to 3 weeks. Pay special attention to handwashing as this can help prevent the spread of the virus.   Always  read the labels of cough and cold medications as they may contain some of the ingredients below.  Rest, push lots of fluids (especially water), and utilize supportive care for symptoms. You may take acetaminophen (Tylenol) every 4-6 hours and ibuprofen every 6-8 hours for muscle pain, joint pain, headaches (you may also alternate these medications). Mucinex (guaifenesin) may be taken over the counter for cough as needed can loosen phlegm. Please read the instructions and take as directed.  Sudafed (pseudophedrine) is sold behind the counter and can help reduce nasal pressure; avoid taking this if you have high blood pressure or feel jittery. Sudafed PE (phenylephrine) can be a helpful, short-term, over-the-counter alternative to limit side effects or if you have high blood pressure.  Flonase nasal spray can help alleviate congestion and sinus pressure. Many patients choose Afrin as a nasal decongestant; do not use for more than 3 days for risk of rebound (increased symptoms after stopping medication).  Saline nasal sprays or rinses can also help nasal congestion (use bottled or sterile water). Warm tea with lemon and honey can sooth sore throat and cough, as can cough drops.   Return to clinic for high fever not improving with medications, chest pain, difficulty breathing, non-stop vomiting, or coughing blood. Follow-up with your primary care provider if symptoms do not improve as expected in the next 5-7 days.          Amalia Greenhouse, FNP 06/25/21 1719

## 2021-06-25 NOTE — Discharge Instructions (Signed)
Take prednisone as prescribed  For most people this is a self-limiting process and can take anywhere from 7 - 10 days to start feeling better. A cough can last up to 3 weeks. Pay special attention to handwashing as this can help prevent the spread of the virus.   Always read the labels of cough and cold medications as they may contain some of the ingredients below.  Rest, push lots of fluids (especially water), and utilize supportive care for symptoms. You may take acetaminophen (Tylenol) every 4-6 hours and ibuprofen every 6-8 hours for muscle pain, joint pain, headaches (you may also alternate these medications). Mucinex (guaifenesin) may be taken over the counter for cough as needed can loosen phlegm. Please read the instructions and take as directed.  Sudafed (pseudophedrine) is sold behind the counter and can help reduce nasal pressure; avoid taking this if you have high blood pressure or feel jittery. Sudafed PE (phenylephrine) can be a helpful, short-term, over-the-counter alternative to limit side effects or if you have high blood pressure.  Flonase nasal spray can help alleviate congestion and sinus pressure. Many patients choose Afrin as a nasal decongestant; do not use for more than 3 days for risk of rebound (increased symptoms after stopping medication).  Saline nasal sprays or rinses can also help nasal congestion (use bottled or sterile water). Warm tea with lemon and honey can sooth sore throat and cough, as can cough drops.   Return to clinic for high fever not improving with medications, chest pain, difficulty breathing, non-stop vomiting, or coughing blood. Follow-up with your primary care provider if symptoms do not improve as expected in the next 5-7 days.  

## 2021-07-06 ENCOUNTER — Ambulatory Visit
Admission: RE | Admit: 2021-07-06 | Discharge: 2021-07-06 | Disposition: A | Discharge status: Home / Self Care | Payer: 59 | Source: Ambulatory Visit | Attending: Obstetrics | Admitting: Obstetrics

## 2021-07-06 ENCOUNTER — Other Ambulatory Visit: Payer: Self-pay

## 2021-07-06 DIAGNOSIS — Z1239 Encounter for other screening for malignant neoplasm of breast: Secondary | ICD-10-CM

## 2021-07-06 DIAGNOSIS — Z1231 Encounter for screening mammogram for malignant neoplasm of breast: Secondary | ICD-10-CM | POA: Diagnosis not present

## 2021-07-25 ENCOUNTER — Other Ambulatory Visit: Payer: Self-pay

## 2021-07-25 ENCOUNTER — Encounter: Payer: Self-pay | Admitting: Nurse Practitioner

## 2021-07-25 ENCOUNTER — Ambulatory Visit: Payer: 59 | Admitting: Nurse Practitioner

## 2021-07-25 VITALS — BP 130/70 | HR 85 | Temp 98.0°F | Ht 64.0 in | Wt 388.4 lb

## 2021-07-25 DIAGNOSIS — Z0000 Encounter for general adult medical examination without abnormal findings: Secondary | ICD-10-CM

## 2021-07-25 DIAGNOSIS — F419 Anxiety disorder, unspecified: Secondary | ICD-10-CM | POA: Diagnosis not present

## 2021-07-25 DIAGNOSIS — Z6841 Body Mass Index (BMI) 40.0 and over, adult: Secondary | ICD-10-CM | POA: Diagnosis not present

## 2021-07-25 MED ORDER — ALPRAZOLAM 0.5 MG PO TABS: 0.5000 mg | ORAL_TABLET | Freq: Three times a day (TID) | ORAL | 0 refills | Status: DC | PRN

## 2021-07-25 NOTE — Progress Notes (Signed)
I,Tianna Badgett,acting as a Neurosurgeon for [employer], NP.,have documented all relevant documentation on the behalf of [employer], NP,as directed by  Charlesetta Ivory, NP while in the presence of Charlesetta Ivory, NP.  This visit occurred during the SARS-CoV-2 public health emergency.  Safety protocols were in place, including screening questions prior to the visit, additional usage of staff PPE, and extensive cleaning of exam room while observing appropriate contact time as indicated for disinfecting solutions.  Subjective:     [name] , female    DOB: 1981-07-21 , 40 y.o.   MRN: [id number]   Chief Complaint  Patient presents with   Establish Care    HPI  Patient is here to establish care. She was seeing Dr. Allyne Gee x 3 years ago. She is not taking any medication currently but was taking something for anxiety. She wants to address anxiety and she does not sleep. She was taking xanax before with Dr Allyne Gee.  She does coding in the office. And has anxiety at work and at home. She is also concerned that she is gaining weight.  She drinks occasionally. She does not smoke.   Wt Readings from Last 3 Encounters: 07/25/21 : (!) 388 lb 6.4 oz (176.2 kg) 05/25/21 : (!) 373 lb (169.2 kg) 03/19/16 : (!) 341 lb 9.6 oz (154.9 kg)     Past Medical History:  Diagnosis Date   Benign essential hypertension    Dysuria    Gastritis    Insomnia    Malaise and fatigue    Malignant essential hypertension    Morbid obesity (HCC)    Panic disorder    Vitamin D deficiency      Family History  Problem Relation Age of Onset   Aneurysm Father    Migraines Sister    Heart disease Paternal Grandfather      Current Outpatient Medications:    ALPRAZolam (XANAX) 0.5 MG tablet, Take 1 tablet (0.5 mg total) by mouth 3 (three) times daily as needed for sleep or anxiety., Disp: 30 tablet, Rfl: 0   No Known Allergies   Review of Systems  Constitutional: Negative.   Negative for chills and fever.  Respiratory: Negative.  Negative for cough, shortness of breath and wheezing.   Cardiovascular:  Negative for chest pain and palpitations.  Gastrointestinal: Negative.  Negative for constipation, diarrhea, nausea and vomiting.  Neurological: Negative.   Psychiatric/Behavioral:  Positive for sleep disturbance.        Anxiety     Today's Vitals   07/25/21 0929  BP: 130/70  Pulse: 85  Temp: 98 F (36.7 C)  TempSrc: Oral  Weight: (!) 388 lb 6.4 oz (176.2 kg)  Height: 5\' 4"  (1.626 m)   Body mass index is 66.67 kg/m.  Wt Readings from Last 3 Encounters:  07/25/21 (!) 388 lb 6.4 oz (176.2 kg)  05/25/21 (!) 373 lb (169.2 kg)  03/19/16 (!) 341 lb 9.6 oz (154.9 kg)    Objective:  Physical Exam Constitutional:      Appearance: Normal appearance. She is obese.  HENT:     Head: Normocephalic and atraumatic.  Cardiovascular:     Rate and Rhythm: Normal rate and regular rhythm.     Pulses: Normal pulses.     Heart sounds: Normal heart sounds. No murmur heard. Pulmonary:     Effort: Pulmonary effort is normal. No respiratory distress.     Breath sounds: Normal breath sounds. No wheezing.  Skin:    Capillary Refill:  Capillary refill takes less than 2 seconds.  Neurological:     Mental Status: She is alert and oriented to person, place, and time.  Psychiatric:        Mood and Affect: Mood is anxious.        Assessment And Plan:     1. Encounter for medical examination to establish care --Patient is here to establish care. Micah Flesher over patient medical, family, social and surgical history. -Reviewed with patient their medications and any allergies  -Reviewed with patient their sexual orientation, drug/tobacco and alcohol use -Dicussed any new concerns with patient  -recommended patient comes in for a physical exam and complete blood work.  -Educated patient about the importance of annual screenings and immunizations.  -Advised patient to eat a  healthy diet along with exercise for atleast 30-45 min atleast 4-5 days of the week.  -Patient will return in 1-2 month for a follow up and also physical exam where all of her lab work will be taken.  -will address anxiety today.   2. Anxiety - Ambulatory referral to Psychology - ALPRAZolam (XANAX) 0.5 MG tablet; Take 1 tablet (0.5 mg total) by mouth 3 (three) times daily as needed for sleep or anxiety.  Dispense: 30 tablet; Refill: 0 - Ambulatory referral to Psychiatry  3. Morbid obesity due to excess calories (HCC) -Advised patient on a healthy diet including avoiding fast food and red meats. Increase the intake of lean meats including grilled chicken and [place].  Drink a lot of water. Decrease intake of fatty foods. Exercise for 30-45 min. 4-5 a week to decrease the risk of cardiac event.  -She will look into the bariatric clinic and call them .  The patient was encouraged to call or send a message through MyChart for any questions or concerns.   Follow up: if symptoms persist or do not get better.   Staying healthy and adopting a healthy lifestyle for your overall health is important. You should eat 7 or more servings of fruits and vegetables per day. You should drink plenty of water to keep yourself hydrated and your kidneys healthy. This includes about 65-80+ fluid ounces of water. Limit your intake of animal fats especially for elevated cholesterol. Avoid highly processed food and limit your salt intake if you have hypertension. Avoid foods high in saturated/Trans fats. Along with a healthy diet it is also very important to maintain time for yourself to maintain a healthy mental health with low stress levels. You should get atleast 150 min of moderate intensity exercise weekly for a healthy heart. Along with eating right and exercising, aim for at least 7-9 hours of sleep daily.  Eat more whole grains which includes barley, wheat berries, oats, brown rice and whole wheat pasta. Use healthy  plant oils which include olive, soy, corn, sunflower and peanut. Limit your caffeine and sugary drinks. Limit your intake of fast foods. Limit milk and dairy products to one or two daily servings.    Patient was given opportunity to ask questions. Patient verbalized understanding of the plan and was able to repeat key elements of the plan. All questions were answered to their satisfaction.  Raman Lamaj Metoyer, DNP   I, Raman Damon Baisch have reviewed all documentation for this visit. The documentation on 12/28/20 for the exam, diagnosis, procedures, and orders are all accurate and complete.   IF YOU HAVE BEEN REFERRED TO A SPECIALIST, IT MAY TAKE 1-2 WEEKS TO SCHEDULE/PROCESS THE REFERRAL. IF YOU HAVE NOT HEARD FROM US/SPECIALIST IN  TWO WEEKS, PLEASE GIVE [place] A CALL AT [phone].   THE PATIENT IS ENCOURAGED TO PRACTICE SOCIAL DISTANCING DUE TO THE COVID-19 PANDEMIC.

## 2021-07-25 NOTE — Patient Instructions (Signed)

## 2021-07-30 ENCOUNTER — Ambulatory Visit: Payer: 59 | Admitting: Nurse Practitioner

## 2021-08-02 ENCOUNTER — Ambulatory Visit: Payer: 59 | Admitting: Nurse Practitioner

## 2021-08-28 ENCOUNTER — Other Ambulatory Visit: Payer: Self-pay

## 2021-08-28 ENCOUNTER — Encounter: Payer: Self-pay | Admitting: Nurse Practitioner

## 2021-08-28 ENCOUNTER — Ambulatory Visit: Payer: 59 | Admitting: Nurse Practitioner

## 2021-08-28 VITALS — BP 122/78 | HR 97 | Temp 98.6°F | Ht 64.0 in | Wt >= 6400 oz

## 2021-08-28 DIAGNOSIS — R635 Abnormal weight gain: Secondary | ICD-10-CM

## 2021-08-28 DIAGNOSIS — R7303 Prediabetes: Secondary | ICD-10-CM

## 2021-08-28 DIAGNOSIS — R519 Headache, unspecified: Secondary | ICD-10-CM | POA: Diagnosis not present

## 2021-08-28 DIAGNOSIS — R0602 Shortness of breath: Secondary | ICD-10-CM

## 2021-08-28 DIAGNOSIS — R0683 Snoring: Secondary | ICD-10-CM | POA: Diagnosis not present

## 2021-08-28 DIAGNOSIS — F419 Anxiety disorder, unspecified: Secondary | ICD-10-CM | POA: Diagnosis not present

## 2021-08-28 DIAGNOSIS — Z6841 Body Mass Index (BMI) 40.0 and over, adult: Secondary | ICD-10-CM

## 2021-08-28 DIAGNOSIS — R601 Generalized edema: Secondary | ICD-10-CM | POA: Diagnosis not present

## 2021-08-28 DIAGNOSIS — R002 Palpitations: Secondary | ICD-10-CM

## 2021-08-28 MED ORDER — ALBUTEROL SULFATE HFA 108 (90 BASE) MCG/ACT IN AERS: 2.0000 | INHALATION_SPRAY | Freq: Four times a day (QID) | RESPIRATORY_TRACT | 2 refills | Status: DC | PRN

## 2021-08-28 MED ORDER — HYDROCHLOROTHIAZIDE 25 MG PO TABS: 25.0000 mg | ORAL_TABLET | Freq: Every day | ORAL | 0 refills | Status: DC

## 2021-08-28 NOTE — Patient Instructions (Signed)
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
## 2021-08-29 ENCOUNTER — Ambulatory Visit
Admission: RE | Admit: 2021-08-29 | Discharge: 2021-08-29 | Disposition: A | Discharge status: Home / Self Care | Payer: 59 | Source: Ambulatory Visit | Attending: Nurse Practitioner | Admitting: Nurse Practitioner

## 2021-08-29 ENCOUNTER — Ambulatory Visit
Admission: RE | Admit: 2021-08-29 | Discharge: 2021-08-29 | Disposition: A | Discharge status: Home / Self Care | Payer: 59 | Attending: Nurse Practitioner | Admitting: Nurse Practitioner

## 2021-08-29 DIAGNOSIS — R0602 Shortness of breath: Secondary | ICD-10-CM | POA: Insufficient documentation

## 2021-08-29 DIAGNOSIS — R601 Generalized edema: Secondary | ICD-10-CM | POA: Insufficient documentation

## 2021-08-29 DIAGNOSIS — I517 Cardiomegaly: Secondary | ICD-10-CM | POA: Diagnosis not present

## 2021-08-29 LAB — CMP14+EGFR
ALT: 21 IU/L (ref 0–32)
AST: 16 IU/L (ref 0–40)
Albumin/Globulin Ratio: 1.6 (ref 1.2–2.2)
Albumin: 4.6 g/dL (ref 3.8–4.8)
Alkaline Phosphatase: 60 IU/L (ref 44–121)
BUN/Creatinine Ratio: 14 (ref 9–23)
BUN: 14 mg/dL (ref 6–24)
Bilirubin Total: <0.2 mg/dL (ref 0.0–1.2)
CO2: 19 mmol/L — ABNORMAL LOW (ref 20–29)
Calcium: 9.8 mg/dL (ref 8.7–10.2)
Chloride: 101 mmol/L (ref 96–106)
Creatinine, Ser: 1 mg/dL (ref 0.57–1.00)
Globulin, Total: 2.9 g/dL (ref 1.5–4.5)
Glucose: 110 mg/dL — ABNORMAL HIGH (ref 70–99)
Potassium: 4.8 mmol/L (ref 3.5–5.2)
Sodium: 135 mmol/L (ref 134–144)
Total Protein: 7.5 g/dL (ref 6.0–8.5)
eGFR: 72 mL/min/{1.73_m2} (ref >59)

## 2021-08-29 LAB — TSH: TSH: 3.64 u[IU]/mL (ref 0.450–4.500)

## 2021-08-29 LAB — HEMOGLOBIN A1C
Est. average glucose Bld gHb Est-mCnc: 143 mg/dL
Hgb A1c MFr Bld: 6.6 % — ABNORMAL HIGH (ref 4.8–5.6)

## 2021-08-29 LAB — BRAIN NATRIURETIC PEPTIDE: BNP: 7.8 pg/mL (ref 0.0–100.0)

## 2021-08-29 LAB — INSULIN, RANDOM: INSULIN: 170 u[IU]/mL — ABNORMAL HIGH (ref 2.6–24.9)

## 2021-09-04 ENCOUNTER — Encounter: Payer: Self-pay | Admitting: Psychology

## 2021-09-04 ENCOUNTER — Ambulatory Visit (INDEPENDENT_AMBULATORY_CARE_PROVIDER_SITE_OTHER): Payer: 59 | Admitting: Psychology

## 2021-09-04 DIAGNOSIS — F419 Anxiety disorder, unspecified: Secondary | ICD-10-CM

## 2021-09-04 NOTE — Progress Notes (Signed)
? ? ? ? ? ? ? ? ? ? ? ? ? ? ?  Macallan Ord, LCMHC ?

## 2021-09-04 NOTE — Progress Notes (Signed)
[facility] Counselor Initial Adult Exam  Name: Dorothy Sullivan Date: 09/04/2021 MRN: [id number] DOB: September 18, 1980 PCP: Charlesetta Ivory, NP  Time spent: 10:06am-11:03am   pt is seen for a virtual video visit via webex.  Pt joins from her home and counselor from her home office.   Guardian/Payee:  self    Reason for Visit /Presenting Problem: Pt has been referred for counseling by her PCP due to anxiety.  Pt reported that her anxiety "has really picked up" in the past couple of months.  Pt reports she has experienced anxiety in the past- first happening after the death of her father in 12/27/2005.  Pt reported she experienced fear of loved ones dying.  Pt reported that this has reemerged recently w/ reported pattern of family member dying 6 months after the birth of a family member.  Pt acknowledges this as distortion.  Pt reported birth of her grandson May 2022.  Pt reports this worry has subsided some recently.  Pt reports a lot of stress from job position just left as reports didn't hold up agreement for remote work.  Pt reports she has been struggling w/ not being able to sleep and fatigue.  Pt reports feeling that more irritability and decreased control of how she is responding to others.  Pt reports decreased attention and focus feeling more scattered/brain fog- making more mistakes. Pt reports feeling that doesn't know where to find her joy.  Pt feels that her significant weight increase has impacted this.  Pt reports she has gained 100lbs in 16 months.  Pt reports she is now dealing w/ related healthy concerns- heart enlarged, prediabetic, wheezing, inflammation and thyroid concerns. Pt reports she has been referred to sleep doctor, cardiologist and psychiatrist for sleep meds if needed.   Mental Status Exam: Appearance:   Well Groomed     Behavior:  Appropriate  Motor:  Normal  Speech/Language:   Normal Rate  Affect:  Appropriate  Mood:  anxious  Thought process:  normal   Thought content:    WNL  Sensory/Perceptual disturbances:    WNL  Orientation:  oriented to person, place, time/date, and situation  Attention:  Good  Concentration:  Good  Memory:  WNL  Fund of knowledge:   Good  Insight:    Good  Judgment:   Good  Impulse Control:  Good    Reported Symptoms:  increased anxiety, increased worry, increased worry of death of family member, difficulty initiating sleep, difficulty maintaining sleep, report of sudden weight gain in past year, increased irritability.    Risk Assessment: Danger to Self:  No Self-injurious Behavior: No Danger to Others: No Duty to Warn:no Physical Aggression / Violence:No  Access to Firearms a concern: No  Gang Involvement:No  Patient / guardian was educated about steps to take if suicide or homicide risk level increases between visits: n/a While future psychiatric events cannot be accurately predicted, the patient does not currently require acute inpatient psychiatric care and does not currently meet [facility] involuntary commitment criteria.  Substance Abuse History: Current substance abuse: No     Past Psychiatric History:   No previous psychological problems have been observed Outpatient Providers: couple times in past for relationship stressors.   History of Psych Hospitalization: No  Psychological Testing:  none    Abuse History:  Victim of: No.,  none reported     Report needed: No. Victim of Neglect:No. Perpetrator of  no hx   Witness / Exposure to Domestic Violence: No  Protective Services Involvement: No  Witness to Community Violence:  No   Family History:  Family History  Problem Relation Age of Onset   Aneurysm Father    Migraines Sister    Heart disease Paternal Grandfather     Living situation: the patient lives with their family daughter lives w/ her.  Daughter currently staying currently w/ her grandmother.  Mom lives [place]. Grew up in [place].  In [place] years.   Graduated then left to go college.  Sister was in the area.  Thanksgiving staying.  Dog stays w/ her.  1 brother 2 years form sister., 1 sister.5-6 years  Youngest.  Sister lives in [place].  Brother lives w/.   Talk w/ mom daily.  Not close to family.   Dad died in 2007  . Sexual Orientation: Straight  Relationship Status: in relationship.  Pt hasn't been married.  Pt reports currently relationship "is complicated".   If a parent, number of children / ages: 1 adult daughter.   Pt grandson born may 2022.    Support Systems: friends  Financial Stress:   not current, but in past year when unemployed.  Income/Employment/Disability: Employment.   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  Pt reports she works 5:30am-2pm Mon- Friday remote work.  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  Pt didn't enjoy w/ long commute.  Pt was  Unemployed April to august 2023.  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  Pt prior worked in Restaurant manager, fast foodcosmetology.    Military Service: No   Educational History: Education: some college at [employer].  Left and went to cosmetology school. Completed At home study for medical coding/billing.    Religion/Sprituality/World View: Not reported  Any cultural differences that may affect / interfere with treatment:  not applicable   Recreation/Hobbies: enjoy my friends, brunch, movies.    Stressors: Health problems   Occupational concerns    Strengths: Friends  Barriers:  Pt acknowledges if she isn't consistent w/ scheduling- will drop off    Legal History: Pending legal issue / charges: The patient has no significant history of legal issues. History of legal issue / charges:  none  Medical  History/Surgical History: reviewed Past Medical History:  Diagnosis Date   Benign essential hypertension    Dysuria    Gastritis    Insomnia    Malaise and fatigue    Malignant essential hypertension    Morbid obesity (HCC)    Panic disorder    Vitamin D deficiency     Past Surgical History:  Procedure Laterality Date   CESAREAN SECTION     TONSILLECTOMY     TUBAL LIGATION      Medications: Current Outpatient Medications  Medication Sig Dispense Refill   albuterol (VENTOLIN HFA) 108 (90 Base) MCG/ACT inhaler Inhale 2 puffs into the lungs every 6 (six) hours as needed for wheezing or shortness of breath. 8 g 2   ALPRAZolam (XANAX) 0.5 MG tablet Take 1 tablet (0.5 mg total) by mouth 3 (three) times daily as needed for sleep or anxiety. 30 tablet 0   hydrochlorothiazide (HYDRODIURIL) 25 MG tablet Take 1 tablet (25 mg total) by mouth  daily. 30 tablet 0   No current facility-administered medications for this visit.    No Known Allergies  Diagnoses:  Anxiety  Plan of Care: Pt to f/u w/ biweekly counseling to assist coping w/ stressors and anxiety.  Pt to f/u as scheduled w/ PCP and referrals to specialists.  Pt and counselor to develop tx plan at next session.     Forde Radon, [facility]

## 2021-09-10 ENCOUNTER — Encounter: Payer: Self-pay | Admitting: Nurse Practitioner

## 2021-09-10 ENCOUNTER — Other Ambulatory Visit: Payer: Self-pay | Admitting: Nurse Practitioner

## 2021-09-10 DIAGNOSIS — E1169 Type 2 diabetes mellitus with other specified complication: Secondary | ICD-10-CM

## 2021-09-10 MED ORDER — MOUNJARO 2.5 MG/0.5ML ~~LOC~~ SOAJ: 2.5000 mg | SUBCUTANEOUS | 0 refills | Status: DC

## 2021-09-11 ENCOUNTER — Other Ambulatory Visit: Payer: Self-pay

## 2021-09-11 DIAGNOSIS — E669 Obesity, unspecified: Secondary | ICD-10-CM

## 2021-09-11 MED ORDER — MOUNJARO 2.5 MG/0.5ML ~~LOC~~ SOAJ: 2.5000 mg | SUBCUTANEOUS | 0 refills | Status: DC

## 2021-09-12 ENCOUNTER — Other Ambulatory Visit: Payer: Self-pay

## 2021-09-12 ENCOUNTER — Ambulatory Visit
Admission: EM | Admit: 2021-09-12 | Discharge: 2021-09-12 | Disposition: A | Discharge status: Home / Self Care | Payer: 59 | Attending: Emergency Medicine | Admitting: Emergency Medicine

## 2021-09-12 DIAGNOSIS — L0231 Cutaneous abscess of buttock: Secondary | ICD-10-CM | POA: Diagnosis not present

## 2021-09-12 MED ORDER — KETOROLAC TROMETHAMINE 30 MG/ML IJ SOLN
30.0000 mg | Freq: Once | INTRAMUSCULAR | Status: AC
  Administered 2021-09-12: 16:00:00 30 mg via INTRAMUSCULAR

## 2021-09-12 MED ORDER — FLUCONAZOLE 150 MG PO TABS: ORAL_TABLET | ORAL | 0 refills | Status: DC

## 2021-09-12 MED ORDER — CEFTRIAXONE SODIUM 1 G IJ SOLR
2.0000 g | Freq: Once | INTRAMUSCULAR | Status: AC
  Administered 2021-09-12: 16:00:00 2 g via INTRAMUSCULAR

## 2021-09-12 MED ORDER — SULFAMETHOXAZOLE-TRIMETHOPRIM 800-160 MG PO TABS: 1.0000 | ORAL_TABLET | Freq: Two times a day (BID) | ORAL | 0 refills | Status: AC

## 2021-09-12 NOTE — Discharge Instructions (Signed)
During your visit today, there was a significant amount of purulent and bloody fluid drained from the abscess on your left buttock.  After the purulent fluid drained completely, what remained was clear fluid that was slightly bloody.  I am confident that I have removed most if not all of the offending infected material from the wound at this time.  Because of this, I am also confident that the injection of ceftriaxone along with a 7-day course of Bactrim tablets twice daily will handily resolve this infection completely.  A prescription for Bactrim has been sent to your pharmacy.  You also received an injection of ketorolac in the office today, this is a very powerful anti-inflammatory pain medication which should provide you relief of your pain.  Because we all know that taking antibiotics causes vaginal yeast infections, I provided you with a prescription for Diflucan.  Please take the first tablet on day 4 of your Bactrim treatment, take the second tablet 3 days later.  To care for your wound, please change your dressing twice daily as needed while there is still drainage.  Once there is no longer any drainage on the bandaging, you can discontinue dressing the wound.  Please follow-up either with urgent care or the emergency room if you have not had significant relief of your pain, swelling around the abscess in the next 48 hours.  Thank you for visiting urgent care today.  I appreciate the opportunity to participate in your care.  Please let us know if there is anything else we can do for you.

## 2021-09-12 NOTE — ED Triage Notes (Signed)
Pt presents with abscess to L lower buttocks worsening x 2 days.  States it is approx 2 inches across.  Very tender.  Has tried heat packs, alcohol wipes.

## 2021-09-12 NOTE — ED Provider Notes (Signed)
[facility]    CSN: [id number] Arrival date & time: 09/12/21  1438    HISTORY   Chief Complaint  Patient presents with   Abscess    APPT 2:30    HPI Dorothy Sullivan is a 41 y.o. female. Pt presents with abscess on her left lower buttock which has been worsening x 2 days.  States it is approx 2 inches across, very tender.  States she has tried heat packs, alcohol wipes with no improvement.   The history is provided by the patient.  Past Medical History:  Diagnosis Date   Benign essential hypertension    Dysuria    Gastritis    Insomnia    Malaise and fatigue    Malignant essential hypertension    Morbid obesity (HCC)    Panic disorder    Vitamin D deficiency    Patient Active Problem List   Diagnosis Date Noted   Fatigue 09/11/2015   Sleep talking 09/11/2015   Snoring 09/11/2015   Sleep related headaches 09/11/2015   Morbid obesity due to excess calories (HCC) 09/11/2015   BV (bacterial vaginosis) 12/14/2012   Past Surgical History:  Procedure Laterality Date   CESAREAN SECTION     TONSILLECTOMY     TUBAL LIGATION     OB History     Gravida  1   Para  1   Term  1   Preterm      AB      Living  1      SAB      IAB      Ectopic      Multiple      Live Births  1          Home Medications    Prior to Admission medications   Medication Sig Start Date End Date Taking? Authorizing Provider  albuterol (VENTOLIN HFA) 108 (90 Base) MCG/ACT inhaler Inhale 2 puffs into the lungs every 6 (six) hours as needed for wheezing or shortness of breath. 08/28/21  Yes Arnette FeltsMoore, Janece, FNP  ALPRAZolam Prudy Feeler(XANAX) 0.5 MG tablet Take 1 tablet (0.5 mg total) by mouth 3 (three) times daily as needed for sleep or anxiety. 07/25/21 07/25/22 Yes Ghumman, Ramandeep, NP  tirzepatide [facility](MOUNJARO) 2.5 MG/0.5ML Pen Inject 2.5 mg into the skin once a week. 09/11/21  Yes Arnette FeltsMoore, Janece, FNP    Family History Family History  Problem Relation Age of Onset   Aneurysm  Father    Migraines Sister    Heart disease Paternal Grandfather    Social History Social History   Tobacco Use   Smoking status: Never   Smokeless tobacco: Never  Substance Use Topics   Alcohol use: Yes    Alcohol/week: 0.0 standard drinks    Comment: occasional   Drug use: No   Allergies   Patient has no known allergies.  Review of Systems Review of Systems Pertinent findings noted in history of present illness.   Physical Exam Triage Vital Signs ED Triage Vitals  Enc Vitals Group     BP 06/15/21 0827 (!) 147/82     Pulse Rate 06/15/21 0827 72     Resp 06/15/21 0827 18     Temp 06/15/21 0827 98.3 F (36.8 C)     Temp Source 06/15/21 0827 Oral     SpO2 06/15/21 0827 98 %     Weight --      Height --      Head Circumference --  Peak Flow --      Pain Score 06/15/21 0826 5     Pain Loc --      Pain Edu? --      Excl. in GC? --   No data found.  Updated Vital Signs BP (!) 165/93 (BP Location: Left Wrist)    Pulse (!) 101    Temp 99.1 F (37.3 C) (Oral)    Resp (!) 22    LMP 09/05/2021    SpO2 95%   Physical Exam  Visual Acuity Right Eye Distance:   Left Eye Distance:   Bilateral Distance:    Right Eye Near:   Left Eye Near:    Bilateral Near:     [facility] Couse / Diagnostics / Procedures:    EKG  Radiology No results found.  Procedures Incision and Drainage  Date/Time: 09/12/2021 3:34 PM Performed by: Theadora Rama Scales, PA-C Authorized by: Theadora Rama Scales, PA-C   Consent:    Consent obtained:  Verbal   Risks, benefits, and alternatives were discussed: yes     Risks discussed:  Bleeding, damage to other organs, infection, incomplete drainage and pain   Alternatives discussed:  No treatment, delayed treatment, alternative treatment, observation and referral Universal protocol:    Procedure explained and questions answered to patient or proxy's satisfaction: yes     Patient identity confirmed:  Verbally with patient and arm  band Location:    Type:  Abscess   Location:  Trunk (Left buttock immediately above horizontal gluteal fold over left thigh) Pre-procedure details:    Skin preparation:  Povidone-iodine Sedation:    Sedation type:  None Anesthesia:    Anesthesia method:  None Procedure type:    Complexity:  Simple Procedure details:    Ultrasound guidance: no     Incision types:  Stab incision   Incision depth:  Subcutaneous   Wound management:  Probed and deloculated   Drainage:  Serosanguinous, serous, purulent and bloody (Initially purulent and bloody transitioning to serous only towards the end)   Drainage amount:  Copious   Wound treatment:  Wound left open   Packing materials:  None Post-procedure details:    Procedure completion:  Tolerated Comments:     Wound was covered with Telfa, advised twice daily dressing changes until drainage resolves (including critical care time)  UC Diagnoses / Final Clinical Impressions(s)   I have reviewed the triage vital signs and the nursing notes.  Pertinent labs & imaging results that were available during my care of the patient were reviewed by me and considered in my medical decision making (see chart for details).    Final diagnoses:  Abscess of left buttock   Abscess drained without incident, patient tolerated very well.  Recommend twice daily dressing changes.  Patient received ceftriaxone in the office and prescription for Bactrim.  Patient also provided with Diflucan given inevitability of vaginal yeast infection secondary to antibiotic treatment.  Return precautions advised.  Patient advised to monitor wound for worsening pain or swelling, increased purulence.  Patient advised to go to the ED if concerned follow-up here at [facility].  ED Prescriptions     Medication Sig Dispense Auth. Provider   sulfamethoxazole-trimethoprim (BACTRIM DS) 800-160 MG tablet Take 1 tablet by mouth 2 (two) times daily for 7 days. 14 tablet Theadora Rama Scales, PA-C    fluconazole (DIFLUCAN) 150 MG tablet Take 1 tablet on day 4 of antibiotics.  Take second tablet 3 days later. 2 tablet Theadora Rama Scales, PA-C  PDMP not reviewed this encounter.  Pending results:  Labs Reviewed - No data to display  Medications Ordered in UC: Medications  cefTRIAXone (ROCEPHIN) injection 2 g (has no administration in time range)  ketorolac (TORADOL) 30 MG/ML injection 30 mg (has no administration in time range)    Disposition Upon Discharge:  Condition: stable for discharge home Home: take medications as prescribed; routine discharge instructions as discussed; follow up as advised.  Patient presented with an acute illness with associated systemic symptoms and significant discomfort requiring urgent management. In my opinion, this is a condition that a prudent lay person (someone who possesses an average knowledge of health and medicine) may potentially expect to result in complications if not addressed urgently such as respiratory distress, impairment of bodily function or dysfunction of bodily organs.   Routine symptom specific, illness specific and/or disease specific instructions were discussed with the patient and/or caregiver at length.   As such, the patient has been evaluated and assessed, work-up was performed and treatment was provided in alignment with urgent care protocols and evidence based medicine.  Patient/parent/caregiver has been advised that the patient may require follow up for further testing and treatment if the symptoms continue in spite of treatment, as clinically indicated and appropriate.  If the patient was tested for COVID-19, Influenza and/or RSV, then the patient/parent/guardian was advised to isolate at home pending the results of his/her diagnostic coronavirus test and potentially longer if theyre positive. I have also advised pt that if his/her COVID-19 test returns positive, it's recommended to self-isolate for at least 10 days  after symptoms first appeared AND until fever-free for 24 hours without fever reducer AND other symptoms have improved or resolved. Discussed self-isolation recommendations as well as instructions for household member/close contacts as per the [facility] and [place] [facility], and also gave patient the COVID packet with this information.  Patient/parent/caregiver has been advised [facility] in 3-5 days if no better; to PCP or the Emergency Department if new signs and symptoms develop, or if the current signs or symptoms continue to change or worsen for further workup, evaluation and treatment as clinically indicated and appropriate  The patient will follow up with their current PCP if and as advised. If the patient does not currently have a PCP we will assist them in obtaining one.   The patient may need specialty follow up if the symptoms continue, in spite of conservative treatment and management, for further workup, evaluation, consultation and treatment as clinically indicated and appropriate.   Patient/parent/caregiver verbalized understanding and agreement of plan as discussed.  All questions were addressed during visit.  Please see discharge instructions below for further details of plan.  Discharge Instructions:   Discharge Instructions      During your visit today, there was a significant amount of purulent and bloody fluid drained from the abscess on your left buttock.  After the purulent fluid drained completely, what remained was clear fluid that was slightly bloody.  I am confident that I have removed most if not all of the offending infected material from the wound at this time.  Because of this, I am also confident that the injection of ceftriaxone along with a 7-day course of Bactrim tablets twice daily will handily resolve this infection completely.  A prescription for Bactrim has been sent to your pharmacy.  You also received an injection of ketorolac in the office today, this is  a very powerful anti-inflammatory pain medication which should provide  you relief of your pain.  Because we all know that taking antibiotics causes vaginal yeast infections, I provided you with a prescription for Diflucan.  Please take the first tablet on day 4 of your Bactrim treatment, take the second tablet 3 days later.  To care for your wound, please change your dressing twice daily as needed while there is still drainage.  Once there is no longer any drainage on the bandaging, you can discontinue dressing the wound.  Please follow-up either with urgent care or the emergency room if you have not had significant relief of your pain, swelling around the abscess in the next 48 hours.  Thank you for visiting urgent care today.  I appreciate the opportunity to participate in your care.  Please let us know if there is anything else we can do for you.          This office note has been dictated using Teaching laboratory technician.  Unfortunately, and despite my best efforts, this method of dictation can sometimes lead to occasional typographical or grammatical errors.  I apologize in advance if this occurs.     Theadora Rama Scales, PA-C 09/12/21 1538

## 2021-09-18 ENCOUNTER — Ambulatory Visit (INDEPENDENT_AMBULATORY_CARE_PROVIDER_SITE_OTHER): Payer: 59 | Admitting: Psychology

## 2021-09-18 DIAGNOSIS — F419 Anxiety disorder, unspecified: Secondary | ICD-10-CM | POA: Diagnosis not present

## 2021-09-18 NOTE — Progress Notes (Signed)
[facility] Counselor/Therapist Progress Note  [name], MRN: [id number],    Date: 09/18/2021  Time Spent: 2:30pm-3:28pm   Treatment Type: Individual Therapy  Pt is seen for a virtual video visit via webex.  Pt joins from her home and counselor from her home office.   Reported Symptoms: some worry, stress w/ health concerns.  Mental Status Exam: Appearance:  Well Groomed     Behavior: Appropriate  Motor: Normal  Speech/Language:  Normal Rate  Affect: Appropriate  Mood: normal  Thought process: normal  Thought content:   WNL  Sensory/Perceptual disturbances:   WNL  Orientation: oriented to person, place, time/date, and situation  Attention: Good  Concentration: Good  Memory: WNL  Fund of knowledge:  Good  Insight:   Good  Judgment:  Good  Impulse Control: Good   Risk Assessment: Danger to Self:  No Self-injurious Behavior: No Danger to Others: No Duty to Warn:no Physical Aggression / Violence:No  Access to Firearms a concern: No  Gang Involvement:No   Subjective: Counselor assessed pt current functioning per pt and parent report.  Discussed pt goals for tx and developed plan w/ pt.  Explored w/pt current stressors and impacts they have.  Processed w/pt struggles w/ being consistent- maintaining.  Pt affect wnl.  Pt reported that she has been doing better w/ anxiety since reduced stress re: job.  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  Pt reported still having worry about death in family and who will be.  Pt discussed that not ruminating on.  Pt reports still in process of exploring her medical concerns.  Pt discussed starting keto diet today and has been successful for months in the past w/. Pt reports however struggles to maintain if one off day or if emotional stress that's overwhelming.  Pt discussed another stressor in her life is her 9 year relationship and not knowing if what's to continue  and work through trust related to past infidelity.  Pt identifying want to be consistent with her self care for her wellbeing and find her joy.    Interventions: Cognitive Behavioral Therapy and supportive  Diagnosis:Anxiety  Plan: Pt to f/u in 2 weeks for counseling to cope w/ stress and anxiety.  Pt to f/u as scheduled w/ her PCP and specialist referred to. Individualized Treatment Plan Strengths: enjoys time w/ friends, brunch, going to movies.  Entrepreneur   Supports: supportive friendships, good relationship w/ daughter   Goal/Needs for Treatment:  In order of importance to patient 1) working through stressors w/out increased anxiety or avoiding commitments 2) increasing Scientist, water quality of Needs: "I need to be consistent with my commitments. stress of her medical concerns.  Work through whatever arises.  Finding my joy"   Treatment Level:outpatient counseling  Symptoms:anxiety, worry, avoidance and withdrawing, increased irritability, sleep disturbance  Client Treatment Preferences:biweekly counseling, f/u w her PCP re: medical concerns   Healthcare consumer's goal for treatment:  Counselor, Forde Radon, [facility] will support the patient's ability to achieve the goals identified. Cognitive Behavioral Therapy, Assertive Communication/Conflict Resolution Training, Relaxation Training, ACT, Humanistic and other evidenced-based practices will be used to promote progress towards healthy functioning.   Healthcare consumer will: Actively participate in therapy, working towards healthy functioning.    *Justification for Continuation/Discontinuation of Goal: R=Revised, O=Ongoing, A=Achieved, D=Discontinued  Goal 1) Increase effective coping w/ stressors AEB decreased anxiety, irritability, withdrawing from others and commitments. Baseline date 09/18/21 :  Progress towards goal 0; How Often - Daily Target Date Goal Was reviewed Status Code Progress towards goal  09/18/22                 Goal 2)  Finding increased joy in her daily living AEB enjoying activities and engaging with others.   Baseline date 09/18/21: Progress towards goal 0; How Often - Daily  Target Date Goal Was reviewed Status Code Progress towards goal/Likert rating  09/10/22                 This plan has been reviewed and created by the following participants:  This plan will be reviewed at least every 12 months. Date Behavioral Health Clinician Date Guardian/Patient   09/18/21       Forde Radon                                09/18/21 Pt provided verbal consent                     Forde Radon [facility]

## 2021-10-03 ENCOUNTER — Ambulatory Visit (INDEPENDENT_AMBULATORY_CARE_PROVIDER_SITE_OTHER): Payer: 59 | Admitting: Psychology

## 2021-10-03 DIAGNOSIS — F419 Anxiety disorder, unspecified: Secondary | ICD-10-CM | POA: Diagnosis not present

## 2021-10-03 NOTE — Progress Notes (Signed)
[place] Counselor/Therapist Progress Note  [name], MRN: [id number],    Date: 10/03/2021  Time Spent: 2:30pm-3:35pm   Treatment Type: Individual Therapy  Pt is seen for a virtual video visit via webex.  Pt joins from her car outside of her home and counselor from her home office.   Reported Symptoms: stress w/ relationship, irritability, self doubt   Mental Status Exam: Appearance:  Well Groomed     Behavior: Appropriate  Motor: Normal  Speech/Language:  Normal Rate  Affect: Appropriate  Mood: irritable  Thought process: normal  Thought content:   WNL  Sensory/Perceptual disturbances:   WNL  Orientation: oriented to person, place, time/date, and situation  Attention: Good  Concentration: Good  Memory: WNL  Fund of knowledge:  Good  Insight:   Good  Judgment:  Good  Impulse Control: Good   Risk Assessment: Danger to Self:  No Self-injurious Behavior: No Danger to Others: No Duty to Warn:no Physical Aggression / Violence:No  Access to Firearms a concern: No  Gang Involvement:No   Subjective: Counselor assessed pt current functioning per pt and parent report.  Processed w/pt relationship stressors and assisted pt w/ identifying what wanting in relationship and if current relationship meeting.  Explored w/pt wants to end relationship and uncertain if will be able to follow through. Discussed steps can take and barriers when feels more vunerable.  Pt affect congruent w/ expressed frustration re: relationship.  Pt discussed recent interactions w/ boyfriend and pattern of infidelity.  Pt expressed that she is done in relationship and wants to end- but not sure if can.  Pt discussed pattern of ending then getting back together.  Pt worried that won't be able to follow through.  Pt identified that his reaching back out and her hope of change are barriers to follow through.  Pt identified steps she can take to assist in following through- clear  assertive communication- not discussing break up.  Not having further contact and placing barriers to this and a go to person/s for support when wants to reach out.  Interventions: Cognitive Behavioral Therapy and supportive  Diagnosis:Anxiety  Plan: Pt to f/u in 2 weeks for counseling to cope w/ stress and anxiety.  Pt to f/u as scheduled w/ her PCP and specialist referred to. Individualized Treatment Plan Strengths: enjoys time w/ friends, brunch, going to movies.  Entrepreneur   Supports: supportive friendships, good relationship w/ daughter   Goal/Needs for Treatment:  In order of importance to patient 1) working through stressors w/out increased anxiety or avoiding commitments 2) increasing Scientist, water quality of Needs: "I need to be consistent with my commitments. stress of her medical concerns.  Work through whatever arises.  Finding my joy"   Treatment Level:outpatient counseling  Symptoms:anxiety, worry, avoidance and withdrawing, increased irritability, sleep disturbance  Client Treatment Preferences:biweekly counseling, f/u w her PCP re: medical concerns   Healthcare consumer's goal for treatment:  Counselor, Jan Fireman, [facility] will support the patient's ability to achieve the goals identified. Cognitive Behavioral Therapy, Assertive Communication/Conflict Resolution Training, Relaxation Training, ACT, Humanistic and other evidenced-based practices will be used to promote progress towards healthy functioning.   Healthcare consumer will: Actively participate in therapy, working towards healthy functioning.    *Justification for Continuation/Discontinuation of Goal: R=Revised, O=Ongoing, A=Achieved, D=Discontinued  Goal 1) Increase effective coping w/ stressors AEB decreased anxiety, irritability, withdrawing from others and commitments. Baseline date 09/18/21 : Progress towards goal 0; How Often - Daily Target  Date Goal Was reviewed Status Code Progress towards goal   09/18/22                Goal 2)  Finding increased joy in her daily living AEB enjoying activities and engaging with others.   Baseline date 09/18/21: Progress towards goal 0; How Often - Daily  Target Date Goal Was reviewed Status Code Progress towards goal/Likert rating  09/10/22                 This plan has been reviewed and created by the following participants:  This plan will be reviewed at least every 12 months. Date Behavioral Health Clinician Date Guardian/Patient   09/18/21       Jan Fireman                                09/18/21 Pt provided verbal consent                     Jan Fireman [place], [facility]

## 2021-10-09 ENCOUNTER — Other Ambulatory Visit: Payer: Self-pay

## 2021-10-09 MED ORDER — MOUNJARO 5 MG/0.5ML ~~LOC~~ SOAJ: 5.0000 mg | SUBCUTANEOUS | 0 refills | Status: DC

## 2021-10-18 ENCOUNTER — Ambulatory Visit (INDEPENDENT_AMBULATORY_CARE_PROVIDER_SITE_OTHER): Payer: 59 | Admitting: Psychology

## 2021-10-18 DIAGNOSIS — F419 Anxiety disorder, unspecified: Secondary | ICD-10-CM | POA: Diagnosis not present

## 2021-10-18 NOTE — Progress Notes (Addendum)
[place] Counselor/Therapist Progress Note ? ?[name], MRN: [id number],   ? ?Date: 10/18/2021 ? ?Time Spent: 2:30pm-3:03pm  ? ?Treatment Type: Individual Therapy  Pt is seen for a virtual video visit via webex.  Pt joins from her home and counselor from her office.  ? ?Reported Symptoms: consistent w/her health goals, lack of followthrough w/ relationship decision ? ?Mental Status Exam: ?Appearance:  Well Groomed     ?Behavior: Appropriate  ?Motor: Normal  ?Speech/Language:  Normal Rate  ?Affect: Appropriate  ?Mood: normal  ?Thought process: normal  ?Thought content:   WNL  ?Sensory/Perceptual disturbances:   WNL  ?Orientation: oriented to person, place, time/date, and situation  ?Attention: Good  ?Concentration: Good  ?Memory: WNL  ?Fund of knowledge:  Good  ?Insight:   Good  ?Judgment:  Good  ?Impulse Control: Good  ? ?Risk Assessment: ?Danger to Self:  No ?Self-injurious Behavior: No ?Danger to Others: No ?Duty to Warn:no ?Physical Aggression / Violence:No  ?Access to Firearms a concern: No  ?Gang Involvement:No  ? ?Subjective: Counselor assessed pt current functioning per pt and parent report.  Processed w/pt decision re: relationship and exploring readiness for change. Explored w/pt her values and what is important for her and actions she is taking that are consistent w/ that.  Discussed positive steps w/her health goals and focus on consistency.  Pt affect wnl.  Pt reported that she didn't follow through on ending relationship and discussed that continues to be on the fence about relationship.  Pt reports that barrier to decision is lack of care one way or other.  Pt discussed that her current focus is on taking care of self and her health.  Pt reports she has joined the gym and has been going consistently and continues her keto diet.  Pt reported on friend who found out and wanted to join her at gym.  Pt reports that her response will be to avoid this.  ? ?Interventions:  Assertiveness/Communication and ACT and supportive ? ?Diagnosis:Anxiety ? ?Plan: Pt to f/u in 1 month for counseling to cope w/ stress and anxiety.  Pt to f/u as scheduled w/ her PCP and specialist referred to. ?Individualized Treatment Plan ?Strengths: enjoys time w/ friends, brunch, going to movies.  Entrepreneur   ?Supports: supportive friendships, good relationship w/ daughter  ? ?Goal/Needs for Treatment:  ?In order of importance to patient ?1) working through stressors w/out increased anxiety or avoiding commitments ?2) increasing joy ?  ? ?Client Statement of Needs: "I need to be consistent with my commitments. stress of her medical concerns.  Work through whatever arises.  Finding my joy"  ? ?Treatment Level:outpatient counseling  ?Symptoms:anxiety, worry, avoidance and withdrawing, increased irritability, sleep disturbance  ?Client Treatment Preferences:biweekly counseling, f/u w her PCP re: medical concerns  ? ?Healthcare consumer's goal for treatment: ? ?Counselor, Jan Fireman, [facility] will support the patient's ability to achieve the goals identified. Cognitive Behavioral Therapy, Assertive Communication/Conflict Resolution Training, Relaxation Training, ACT, Humanistic and other evidenced-based practices will be used to promote progress towards healthy functioning.  ? ?Healthcare consumer will: Actively participate in therapy, working towards healthy functioning.  ?  ?*Justification for Continuation/Discontinuation of Goal: R=Revised, O=Ongoing, A=Achieved, D=Discontinued ? ?Goal 1) Increase effective coping w/ stressors AEB decreased anxiety, irritability, withdrawing from others and commitments. ?Baseline date 09/18/21 : Progress towards goal 0; How Often - Daily ?Target Date Goal Was reviewed Status Code Progress towards goal  ?09/18/22     ?     ?     ? ?  Goal 2)  Finding increased joy in her daily living AEB enjoying activities and engaging with others.   ?Baseline date 09/18/21: Progress towards goal  0; How Often - Daily ? ?Target Date Goal Was reviewed Status Code Progress towards goal/Likert rating  ?09/10/22     ?     ?     ? ? ?This plan has been reviewed and created by the following participants:  This plan will be reviewed at least every 12 months. ?Date Behavioral Health Clinician Date Guardian/Patient   ?09/18/21       Jan Fireman                                09/18/21 Pt provided verbal consent  ?     ?     ?     ? Jan Fireman, [facility] ?

## 2021-11-02 ENCOUNTER — Other Ambulatory Visit: Payer: Self-pay

## 2021-11-02 ENCOUNTER — Encounter: Payer: Self-pay | Admitting: Nurse Practitioner

## 2021-11-02 MED ORDER — MOUNJARO 7.5 MG/0.5ML ~~LOC~~ SOAJ: 7.5000 mg | SUBCUTANEOUS | 0 refills | Status: DC

## 2021-11-08 ENCOUNTER — Other Ambulatory Visit: Payer: Self-pay

## 2021-11-08 ENCOUNTER — Encounter: Payer: 59 | Admitting: Nurse Practitioner

## 2021-11-08 ENCOUNTER — Encounter: Payer: Self-pay | Admitting: Nurse Practitioner

## 2021-11-08 ENCOUNTER — Ambulatory Visit: Payer: 59 | Admitting: Nurse Practitioner

## 2021-11-08 VITALS — BP 130/72 | HR 93 | Temp 98.6°F | Ht 64.0 in | Wt 375.0 lb

## 2021-11-08 DIAGNOSIS — R9431 Abnormal electrocardiogram [ECG] [EKG]: Secondary | ICD-10-CM | POA: Diagnosis not present

## 2021-11-08 DIAGNOSIS — E1169 Type 2 diabetes mellitus with other specified complication: Secondary | ICD-10-CM

## 2021-11-08 DIAGNOSIS — Z6841 Body Mass Index (BMI) 40.0 and over, adult: Secondary | ICD-10-CM

## 2021-11-08 DIAGNOSIS — M7989 Other specified soft tissue disorders: Secondary | ICD-10-CM | POA: Diagnosis not present

## 2021-11-08 DIAGNOSIS — F419 Anxiety disorder, unspecified: Secondary | ICD-10-CM | POA: Diagnosis not present

## 2021-11-08 MED ORDER — BLOOD GLUCOSE MONITOR KIT: PACK | 3 refills | Status: DC

## 2021-11-08 MED ORDER — FUROSEMIDE 20 MG PO TABS: ORAL_TABLET | ORAL | 1 refills | Status: DC

## 2021-11-08 MED ORDER — ALPRAZOLAM 0.5 MG PO TABS: 0.5000 mg | ORAL_TABLET | Freq: Three times a day (TID) | ORAL | 0 refills | Status: DC | PRN

## 2021-11-08 NOTE — Progress Notes (Signed)
?Industrial/product designer as a Education administrator for [employer], FNP.,have documented all relevant documentation on the behalf of [name], FNP,as directed by  [name], FNP while in the presence of [name], [place]. ? ?This visit occurred during the SARS-CoV-2 public health emergency.  Safety protocols were in place, including screening questions prior to the visit, additional usage of staff PPE, and extensive cleaning of exam room while observing appropriate contact time as indicated for disinfecting solutions. ? ?Subjective:  ?  ? [name] , female    DOB: [birth year] , 41 y.o.   MRN: [id number] ? ? ?Chief Complaint  ?Patient presents with  ? Diabetes  ? ? ?HPI ? ?Patient is here for diabetes follow up. She is currently taking Mounjaro - she is not noticing any change in side effects. She is now on 7.5 mg Mounjaro weekly. ? ?Wt Readings from Last 3 Encounters: ?11/08/21 : (!) 375 lb (170.1 kg) ?08/28/21 : (!) 401 lb (181.9 kg) ?07/25/21 : (!) 388 lb 6.4 oz (176.2 kg) ? ?She is not eating any carbs or sugars, she is exercising 3-4 times a week. She is also doing cardio.   ?Swelling has been for at least one year. She began noticing more within the last year. When not doing much she will continue to have puffiness to her legs. She does work from home as well.  ? ?   ? ?Diabetes ?She presents for her follow-up diabetic visit. She has type 2 diabetes mellitus. There are no hypoglycemic associated symptoms. There are no diabetic associated symptoms.   ? ?Past Medical History:  ?Diagnosis Date  ? Benign essential hypertension   ? Dysuria   ? Gastritis   ? Insomnia   ? Malaise and fatigue   ? Malignant essential hypertension   ? Morbid obesity ([place])   ? Panic disorder   ? Vitamin D deficiency   ?  ? ?Family History  ?Problem Relation Age of Onset  ? Aneurysm Father   ? Migraines Sister   ? Heart disease Paternal Grandfather   ? ? ? ?Current Outpatient Medications:  ?  albuterol (VENTOLIN HFA) 108 (90  Base) MCG/ACT inhaler, Inhale 2 puffs into the lungs every 6 (six) hours as needed for wheezing or shortness of breath., Disp: 8 g, Rfl: 2 ?  blood glucose meter kit and supplies KIT, Dispense based on patient and insurance preference. Check blood sugar 2 times a day. Dx code e11.65, Disp: 1 each, Rfl: 3 ?  furosemide (LASIX) 20 MG tablet, Take 1 tablet by mouth daily as needed for leg swelling., Disp: 30 tablet, Rfl: 1 ?  tirzepatide (MOUNJARO) 7.5 MG/0.5ML Pen, Inject 7.5 mg into the skin once a week., Disp: 2 mL, Rfl: 0 ?  ALPRAZolam (XANAX) 0.5 MG tablet, Take 1 tablet (0.5 mg total) by mouth 3 (three) times daily as needed for sleep or anxiety., Disp: 30 tablet, Rfl: 0  ? ?No Known Allergies  ? ?Review of Systems  ?Constitutional: Negative.   ?Respiratory: Negative.    ?Cardiovascular: Negative.   ?Gastrointestinal: Negative.   ?Musculoskeletal:   ?     Foot prickling.   ?Neurological: Negative.   ?Psychiatric/Behavioral: Negative.     ? ?Today's Vitals  ? 11/08/21 1505  ?BP: 130/72  ?Pulse: 93  ?Temp: 98.6 ?F (37 ?C)  ?TempSrc: Oral  ?Weight: (!) 375 lb (170.1 kg)  ?Height: $RemoveB'5\' 4"'fMfvGwYm$  (1.626 m)  ? ?Body mass index is 64.37 kg/m?.  ?Wt Readings from Last  3 Encounters:  ?11/08/21 (!) 375 lb (170.1 kg)  ?08/28/21 (!) 401 lb (181.9 kg)  ?07/25/21 (!) 388 lb 6.4 oz (176.2 kg)  ? ? ?Objective:  ?Physical Exam ?Vitals reviewed.  ?Constitutional:   ?   General: She is not in acute distress. ?   Appearance: Normal appearance.  ?Cardiovascular:  ?   Rate and Rhythm: Normal rate and regular rhythm.  ?   Pulses: Normal pulses.  ?   Heart sounds: Normal heart sounds. No murmur heard. ?Pulmonary:  ?   Effort: Pulmonary effort is normal. No respiratory distress.  ?   Breath sounds: Normal breath sounds. No wheezing.  ?Musculoskeletal:     ?   General: No swelling or tenderness.  ?   Right lower leg: Edema (trace) present.  ?   Left lower leg: Edema (trace) present.  ?Skin: ?   General: Skin is warm and dry.  ?   Capillary Refill:  Capillary refill takes less than 2 seconds.  ?Neurological:  ?   General: No focal deficit present.  ?   Mental Status: She is alert and oriented to person, place, and time.  ?   Cranial Nerves: No cranial nerve deficit.  ?   Motor: No weakness.  ?Psychiatric:     ?   Mood and Affect: Mood normal.     ?   Behavior: Behavior normal.     ?   Thought Content: Thought content normal.     ?   Judgment: Judgment normal.  ?  ? ?   ?Assessment And Plan:  ?   ?1. Type 2 diabetes mellitus with obesity ([place]) ?Comments: Improving, tolerating Monjauro well. Continue current dose ?- Microalbumin / Creatinine Urine Ratio ?- blood glucose meter kit and supplies KIT; Dispense based on patient and insurance preference. Check blood sugar 2 times a day. Dx code e11.65  Dispense: 1 each; Refill: 3 ? ?2. Anxiety ?Comments: Will refill her Xanax this visit but will consider a medication that decreases risk for dependence if needs increase.  ?- ALPRAZolam (XANAX) 0.5 MG tablet; Take 1 tablet (0.5 mg total) by mouth 3 (three) times daily as needed for sleep or anxiety.  Dispense: 30 tablet; Refill: 0 ? ?3. Morbid obesity due to excess calories ([place]) ?Comments: She has lost 26 lbs since being on Monjauro, encouraged to exercise 150 minutes a week and eat a healthy diet.  ?She is encouraged to strive for BMI less than 30 to decrease cardiac risk.  ? ?4. Leg swelling ?Comments: Intermittent lower extremity edema, trace noted today. Encouraged to wear support socks ?- furosemide (LASIX) 20 MG tablet; Take 1 tablet by mouth daily as needed for leg swelling.  Dispense: 30 tablet; Refill: 1 ? ?5. Abnormal EKG ?Previous EKG done and showed combined atrial enlargement ?- Ambulatory referral to Cardiology ? ? ? ? ?Patient was given opportunity to ask questions. Patient verbalized understanding of the plan and was able to repeat key elements of the plan. All questions were answered to their satisfaction.  ?[name], FNP  ? ?I, [name], FNP,  have reviewed all documentation for this visit. The documentation on 11/08/21 for the exam, diagnosis, procedures, and orders are all accurate and complete.  ? ?IF YOU HAVE BEEN REFERRED TO A SPECIALIST, IT MAY TAKE 1-2 WEEKS TO SCHEDULE/PROCESS THE REFERRAL. IF YOU HAVE NOT HEARD FROM US/SPECIALIST IN TWO WEEKS, PLEASE GIVE [place] A CALL AT [phone] X [phone].  ? ?THE PATIENT IS ENCOURAGED TO PRACTICE SOCIAL  DISTANCING DUE TO THE COVID-19 PANDEMIC.   ?

## 2021-11-08 NOTE — Patient Instructions (Signed)

## 2021-11-21 ENCOUNTER — Ambulatory Visit: Payer: 59 | Admitting: Psychology

## 2021-12-03 ENCOUNTER — Other Ambulatory Visit: Payer: Self-pay

## 2021-12-03 ENCOUNTER — Other Ambulatory Visit (HOSPITAL_COMMUNITY): Payer: Self-pay

## 2021-12-03 MED ORDER — MOUNJARO 10 MG/0.5ML ~~LOC~~ SOAJ
10.0000 mg | SUBCUTANEOUS | 0 refills | Status: DC
  Filled 2021-12-03: qty 2, 28d supply, fill #0

## 2021-12-03 MED ORDER — MOUNJARO 7.5 MG/0.5ML ~~LOC~~ SOAJ: 7.5000 mg | SUBCUTANEOUS | 0 refills | Status: DC

## 2021-12-04 ENCOUNTER — Other Ambulatory Visit (HOSPITAL_COMMUNITY): Payer: Self-pay

## 2022-01-01 ENCOUNTER — Other Ambulatory Visit (HOSPITAL_COMMUNITY): Payer: Self-pay

## 2022-01-01 ENCOUNTER — Other Ambulatory Visit: Payer: Self-pay | Admitting: Nurse Practitioner

## 2022-01-01 MED ORDER — MOUNJARO 10 MG/0.5ML ~~LOC~~ SOAJ
10.0000 mg | SUBCUTANEOUS | 0 refills | Status: DC
  Filled 2022-01-01: qty 2, 28d supply, fill #0

## 2022-01-08 ENCOUNTER — Encounter (HOSPITAL_BASED_OUTPATIENT_CLINIC_OR_DEPARTMENT_OTHER): Payer: Self-pay | Admitting: Cardiovascular Disease

## 2022-01-08 ENCOUNTER — Other Ambulatory Visit (HOSPITAL_COMMUNITY): Payer: Self-pay

## 2022-01-08 ENCOUNTER — Ambulatory Visit (INDEPENDENT_AMBULATORY_CARE_PROVIDER_SITE_OTHER): Payer: 59 | Admitting: Cardiovascular Disease

## 2022-01-08 VITALS — BP 118/84 | HR 89 | Ht 64.0 in | Wt 355.6 lb

## 2022-01-08 DIAGNOSIS — R600 Localized edema: Secondary | ICD-10-CM

## 2022-01-08 DIAGNOSIS — Z5181 Encounter for therapeutic drug level monitoring: Secondary | ICD-10-CM

## 2022-01-08 DIAGNOSIS — M7989 Other specified soft tissue disorders: Secondary | ICD-10-CM

## 2022-01-08 DIAGNOSIS — R030 Elevated blood-pressure reading, without diagnosis of hypertension: Secondary | ICD-10-CM | POA: Diagnosis not present

## 2022-01-08 HISTORY — DX: Elevated blood-pressure reading, without diagnosis of hypertension: R03.0

## 2022-01-08 HISTORY — DX: Localized edema: R60.0

## 2022-01-08 MED ORDER — FUROSEMIDE 20 MG PO TABS: 20.0000 mg | ORAL_TABLET | Freq: Every day | ORAL | 1 refills | Status: DC

## 2022-01-08 MED ORDER — FUROSEMIDE 20 MG PO TABS
20.0000 mg | ORAL_TABLET | Freq: Every day | ORAL | 1 refills | Status: DC
  Filled 2022-01-08: qty 90, 90d supply, fill #0
  Filled 2022-02-12 – 2022-07-12 (×3): qty 90, 90d supply, fill #1

## 2022-01-08 NOTE — Progress Notes (Signed)
Cardiology Office Note:    Date:  02/15/2022   ID:  Dorothy Sullivan, DOB Jan 28, 1981, MRN [id number]  PCP:  [name], FNP   [facility] Providers Cardiologist:  None     Referring MD: [name], FNP   No chief complaint on file.   History of Present Illness:    Dorothy Sullivan is a 41 y.o. female with a hx of diabetes mellitus, hypertension, morbid obesity and insomnia referred here at the request of [name], [place].  She saw her PCP and had mild edema so she was started on Lasix. EKG revealed sinus rhythm and recorded biatrial enlargement so she was referred to cardiology.  Today, she is doing well at this time. The prescription of lasix was given for a week and helped to reduce the swelling for a while. She endorses orthopnea, insomnia and shortness of breath that has been present for almost a year. The swelling is not present everyday and depends on how long she stands for that day. She started a keto diet in January. She drinks a cup of coffee everyday. She reports she also has tingling in her feet and tips of her hands. She has not exercised in the last month but used to the go to the gym 2-3 times a week before that. She does not check her blood pressure at home. She has lost almost 45 pounds since January.   No family history of heart problems. Positive for hypertension. No prior history of smoking.  Past Medical History:  Diagnosis Date   Benign essential hypertension    Dysuria    Elevated blood pressure reading 01/08/2022   Gastritis    Insomnia    Lower extremity edema 01/08/2022   Malaise and fatigue    Malignant essential hypertension    Morbid obesity (HCC)    Panic disorder    Vitamin D deficiency     Past Surgical History:  Procedure Laterality Date   CESAREAN SECTION     TONSILLECTOMY     TUBAL LIGATION      Current Medications: Current Meds  Medication Sig   [DISCONTINUED] tirzepatide (MOUNJARO) 10 MG/0.5ML Pen Inject 1 pen (10 mg)  into the skin once a week.     Allergies:   Patient has no known allergies.   Social History   Socioeconomic History   Marital status: Single    Spouse name: Not on file   Number of children: Not on file   Years of education: Not on file   Highest education level: Not on file  Occupational History    Comment: [employer]  Tobacco Use   Smoking status: Never   Smokeless tobacco: Never  Substance and Sexual Activity   Alcohol use: Yes    Alcohol/week: 0.0 standard drinks of alcohol    Comment: occasional   Drug use: No   Sexual activity: Yes    Partners: Male    Birth control/protection: Surgical  Other Topics Concern   Not on file  Social History Narrative   Drinks 2 cups of caffeine daily.   Social Determinants of Health   Financial Resource Strain: Not on file  Food Insecurity: Not on file  Transportation Needs: Not on file  Physical Activity: Not on file  Stress: Not on file  Social Connections: Not on file     Family History: The patient's family history includes Aneurysm in her father; Heart disease in her paternal grandfather; Kidney disease in her father, paternal grandmother, and sister; Migraines  in her sister.  ROS:   Please see the history of present illness.    (+) leg swelling  (+) orthopnea (+) insomnia (+) shortness of breath (+) tingling in hands and feet  All other systems reviewed and are negative.  EKGs/Labs/Other Studies Reviewed:    The following studies were reviewed today: none  EKG:  EKG is  ordered today. The ekg ordered today demonstrates sinus rhythm rate- 89 bpm with possible LVH  Recent Labs: 08/28/2021: ALT 21; BNP 7.8; TSH 3.640 01/09/2022: Hemoglobin 12.9; Platelets 283 01/22/2022: BUN 15; Creatinine, Ser 0.87; Potassium 4.2; Sodium 138  Recent Lipid Panel    Component Value Date/Time   CHOL 186 01/09/2022 1603   TRIG 87 01/09/2022 1603   HDL 52 01/09/2022 1603   CHOLHDL 3.6 01/09/2022 1603   LDLCALC 118 (H) 01/09/2022 1603      Risk Assessment/Calculations:           Physical Exam:    VS:  BP 118/84 (BP Location: Right Arm, Patient Position: Sitting)   Pulse 89   Ht 5\' 4"  (1.626 m)   Wt (!) 355 lb 9.6 oz (161.3 kg)   BMI 61.04 kg/m     Wt Readings from Last 3 Encounters:  01/09/22 (!) 349 lb 9.6 oz (158.6 kg)  01/08/22 (!) 355 lb 9.6 oz (161.3 kg)  11/08/21 (!) 375 lb (170.1 kg)     GEN:  Well nourished, well developed in no acute distress HEENT: Normal NECK: No JVD; No carotid bruits LYMPHATICS: No lymphadenopathy CARDIAC: RRR, no murmurs, rubs, gallops RESPIRATORY:  Clear to auscultation without rales, wheezing or rhonchi  ABDOMEN: Soft, non-tender, non-distended MUSCULOSKELETAL:  1+ edema bilaterally ; No deformity  SKIN: Warm and dry NEUROLOGIC:  Alert and oriented x 3 PSYCHIATRIC:  Normal affect   ASSESSMENT:    1. Therapeutic drug monitoring   2. Elevated blood pressure reading   3. Lower extremity edema   4. Leg swelling    PLAN:    In order of problems listed above:  Elevated blood pressure reading Blood pressure was initially elevated but better on repeat.  Continue working on diet and exercise for now.  No plans for any medications at this time.  She did not have any improvement in her edema when she was previously started on HCTZ.  Lower extremity edema She has been struggling with lower extremity edema that I suspect is due to venous insufficiency.  However we will also get an echocardiogram to make sure there is no underlying structural heart disease.  Echo does show some signs of LVH and there was a question of cardiomegaly on her chest x-ray.  Starting Lasix as above.  Check a BMP in a week.        Follow-up with APP in 6 weeks and MD in 6 months.  Medication Adjustments/Labs and Tests Ordered: Current medicines are reviewed at length with the patient today.  Concerns regarding medicines are outlined above.  Orders Placed This Encounter  Procedures   Basic  metabolic panel   EKG [id number]   ECHOCARDIOGRAM COMPLETE   Meds ordered this encounter  Medications   DISCONTD: furosemide (LASIX) 20 MG tablet    Sig: Take 1 tablet (20 mg total) by mouth daily.    Dispense:  90 tablet    Refill:  1    D/C PREVIOUS RX   furosemide (LASIX) 20 MG tablet    Sig: Take 1 tablet (20 mg total) by mouth daily.  Dispense:  90 tablet    Refill:  1    D/C PREVIOUS RX    Patient Instructions  Medication Instructions:  START FUROSEMIDE 20 MG DAILY   *If you need a refill on your cardiac medications before your next appointment, please call your pharmacy*  Lab Work: BMET IN 1 WEEK   If you have labs (blood work) drawn today and your tests are completely normal, you will receive your results only by: [place] (if you have MyChart) OR A paper copy in the mail If you have any lab test that is abnormal or we need to change your treatment, we will call you to review the results.  Testing/Procedures: Your physician has requested that you have an echocardiogram. Echocardiography is a painless test that uses sound waves to create images of your heart. It provides your doctor with information about the size and shape of your heart and how well your heart's chambers and valves are working. This procedure takes approximately one hour. There are no restrictions for this procedure.  Follow-Up: At [facility], you and your health needs are our priority.  As part of our continuing mission to provide you with exceptional heart care, we have created designated Provider Care Teams.  These Care Teams include your primary Cardiologist (physician) and Advanced Practice Providers (APPs -  Physician Assistants and Nurse Practitioners) who all work together to provide you with the care you need, when you need it.  We recommend signing up for the patient portal called "MyChart".  Sign up information is provided on this After Visit Summary.  MyChart is used to connect with  patients for Virtual Visits (Telemedicine).  Patients are able to view lab/test results, encounter notes, upcoming appointments, etc.  Non-urgent messages can be sent to your provider as well.   To learn more about what you can do with MyChart, go to [email or web address].    Your next appointment:   6 WEEKS WITH CAITLIN W NP   6 MONTHS WITH DR [place]   WEAR COMPRESSION SOCKS 15-20 mmHg strength     I,Zite Okoli,acting as a scribe for [place], MD.,have documented all relevant documentation on the behalf of Skeet Latch, MD,as directed by  Skeet Latch, MD while in the presence of Skeet Latch, MD.   I, Franklin Oval Linsey, MD have reviewed all documentation for this visit.  The documentation of the exam, diagnosis, procedures, and orders on 02/15/2022 are all accurate and complete.   Signed, Skeet Latch, MD  02/15/2022 11:09 PM     [facility]

## 2022-01-08 NOTE — Patient Instructions (Signed)
Medication Instructions:  START FUROSEMIDE 20 MG DAILY   *If you need a refill on your cardiac medications before your next appointment, please call your pharmacy*  Lab Work: BMET IN 1 WEEK   If you have labs (blood work) drawn today and your tests are completely normal, you will receive your results only by: [place] (if you have MyChart) OR A paper copy in the mail If you have any lab test that is abnormal or we need to change your treatment, we will call you to review the results.  Testing/Procedures: Your physician has requested that you have an echocardiogram. Echocardiography is a painless test that uses sound waves to create images of your heart. It provides your doctor with information about the size and shape of your heart and how well your heart's chambers and valves are working. This procedure takes approximately one hour. There are no restrictions for this procedure.  Follow-Up: At [facility], you and your health needs are our priority.  As part of our continuing mission to provide you with exceptional heart care, we have created designated Provider Care Teams.  These Care Teams include your primary Cardiologist (physician) and Advanced Practice Providers (APPs -  Physician Assistants and Nurse Practitioners) who all work together to provide you with the care you need, when you need it.  We recommend signing up for the patient portal called "MyChart".  Sign up information is provided on this After Visit Summary.  MyChart is used to connect with patients for Virtual Visits (Telemedicine).  Patients are able to view lab/test results, encounter notes, upcoming appointments, etc.  Non-urgent messages can be sent to your provider as well.   To learn more about what you can do with MyChart, go to [email or web address].    Your next appointment:   6 WEEKS WITH CAITLIN W NP   6 MONTHS WITH DR [facility]   WEAR COMPRESSION SOCKS 15-20 mmHg strength

## 2022-01-08 NOTE — Assessment & Plan Note (Signed)
Blood pressure was initially elevated but better on repeat.  Continue working on diet and exercise for now.  No plans for any medications at this time.  She did not have any improvement in her edema when she was previously started on HCTZ.

## 2022-01-08 NOTE — Assessment & Plan Note (Signed)
She has been struggling with lower extremity edema that I suspect is due to venous insufficiency.  However we will also get an echocardiogram to make sure there is no underlying structural heart disease.  Echo does show some signs of LVH and there was a question of cardiomegaly on her chest x-ray.  Starting Lasix as above.  Check a BMP in a week.

## 2022-01-09 ENCOUNTER — Encounter: Payer: Self-pay | Admitting: Nurse Practitioner

## 2022-01-09 ENCOUNTER — Ambulatory Visit (INDEPENDENT_AMBULATORY_CARE_PROVIDER_SITE_OTHER): Payer: 59 | Admitting: Nurse Practitioner

## 2022-01-09 ENCOUNTER — Other Ambulatory Visit (HOSPITAL_COMMUNITY): Payer: Self-pay

## 2022-01-09 VITALS — BP 128/82 | HR 95 | Temp 97.6°F | Ht 64.0 in | Wt 349.6 lb

## 2022-01-09 DIAGNOSIS — E669 Obesity, unspecified: Secondary | ICD-10-CM

## 2022-01-09 DIAGNOSIS — Z0000 Encounter for general adult medical examination without abnormal findings: Secondary | ICD-10-CM | POA: Diagnosis not present

## 2022-01-09 DIAGNOSIS — R601 Generalized edema: Secondary | ICD-10-CM

## 2022-01-09 DIAGNOSIS — Z6841 Body Mass Index (BMI) 40.0 and over, adult: Secondary | ICD-10-CM

## 2022-01-09 DIAGNOSIS — Z79899 Other long term (current) drug therapy: Secondary | ICD-10-CM

## 2022-01-09 DIAGNOSIS — F419 Anxiety disorder, unspecified: Secondary | ICD-10-CM

## 2022-01-09 DIAGNOSIS — E1169 Type 2 diabetes mellitus with other specified complication: Secondary | ICD-10-CM

## 2022-01-09 LAB — POCT URINALYSIS DIPSTICK
Bilirubin, UA: NEGATIVE
Blood, UA: NEGATIVE
Glucose, UA: NEGATIVE
Ketones, UA: 15
Nitrite, UA: NEGATIVE
Protein, UA: NEGATIVE
Spec Grav, UA: 1.01 (ref 1.010–1.025)
Urobilinogen, UA: 0.2 E.U./dL
pH, UA: 5.5 (ref 5.0–8.0)

## 2022-01-09 MED ORDER — GLUCOSE BLOOD VI STRP
ORAL_STRIP | 0 refills | Status: AC
  Filled 2022-01-09: qty 100, 25d supply, fill #0

## 2022-01-09 MED ORDER — ALPRAZOLAM 0.5 MG PO TABS
0.5000 mg | ORAL_TABLET | Freq: Three times a day (TID) | ORAL | 0 refills | Status: DC | PRN
  Filled 2022-01-09: qty 30, 10d supply, fill #0

## 2022-01-09 MED ORDER — BLOOD GLUCOSE MONITOR KIT
PACK | 0 refills | Status: AC
  Filled 2022-01-09: qty 1, 90d supply, fill #0

## 2022-01-09 MED ORDER — FREESTYLE LANCETS MISC
0 refills | Status: AC
  Filled 2022-01-09: qty 100, 25d supply, fill #0

## 2022-01-09 MED ORDER — MOUNJARO 12.5 MG/0.5ML ~~LOC~~ SOAJ
12.5000 mg | SUBCUTANEOUS | 1 refills | Status: DC
Start: 2022-01-09 — End: 2022-08-08
  Filled 2022-01-09: qty 6, 84d supply, fill #0
  Filled 2022-02-12 – 2022-04-04 (×4): qty 6, 84d supply, fill #1

## 2022-01-09 NOTE — Progress Notes (Signed)
Rich Brave [place],acting as a Education administrator for [name], FNP.,have documented all relevant documentation on the behalf of [name], FNP,as directed by  [name], FNP while in the presence of [name], [place].  This visit occurred during the SARS-CoV-2 public health emergency.  Safety protocols were in place, including screening questions prior to the visit, additional usage of staff PPE, and extensive cleaning of exam room while observing appropriate contact time as indicated for disinfecting solutions.  Subjective:     [name] , female    DOB: Mar 31, 1981 , 41 y.o.   MRN: [id number]   Chief Complaint  Patient presents with   Annual Exam    HPI  Patient is here for HM. Patient reports Dr.Harper is her GYN provider. She is to have an ECHO and will follow up with Dr. Oval Linsey in 6 months.   Wt Readings from Last 3 Encounters: 01/09/22 : (!) 349 lb 9.6 oz (158.6 kg) 01/08/22 : (!) 355 lb 9.6 oz (161.3 kg) 11/08/21 : (!) 375 lb (170.1 kg)       Past Medical History:  Diagnosis Date   Benign essential hypertension    Dysuria    Elevated blood pressure reading 01/08/2022   Gastritis    Insomnia    Lower extremity edema 01/08/2022   Malaise and fatigue    Malignant essential hypertension    Morbid obesity (HCC)    Panic disorder    Vitamin D deficiency      Family History  Problem Relation Age of Onset   Kidney disease Father    Aneurysm Father    Migraines Sister    Kidney disease Sister    Kidney disease Paternal Grandmother    Heart disease Paternal Grandfather      Current Outpatient Medications:    blood glucose meter kit and supplies KIT, Use up to four times daily as directed., Disp: 1 each, Rfl: 0   furosemide (LASIX) 20 MG tablet, Take 1 tablet (20 mg total) by mouth daily., Disp: 90 tablet, Rfl: 1   tirzepatide (MOUNJARO) 12.5 MG/0.5ML Pen, Inject 12.5 mg into the skin once a week., Disp: 6 mL, Rfl: 1   albuterol (VENTOLIN HFA)  108 (90 Base) MCG/ACT inhaler, Inhale 2 puffs into the lungs every 6 (six) hours as needed for wheezing or shortness of breath. (Patient not taking: Reported on 01/08/2022), Disp: 8 g, Rfl: 2   ALPRAZolam (XANAX) 0.5 MG tablet, Take 1 tablet (0.5 mg total) by mouth 3 (three) times daily as needed for sleep or anxiety., Disp: 30 tablet, Rfl: 0   glucose blood test strip, Use up to four times daily as directed., Disp: 100 each, Rfl: 0   Lancets (FREESTYLE) lancets, Use up to four times daily as directed., Disp: 100 each, Rfl: 0   No Known Allergies    The patient states she uses tubal ligation for birth control. Patient's last menstrual period was 12/26/2021.. Negative for Dysmenorrhea and Negative for Menorrhagia. Negative for: breast discharge, breast lump(s), breast pain and breast self exam. Associated symptoms include abnormal vaginal bleeding. Pertinent negatives include abnormal bleeding (hematology), anxiety, decreased libido, depression, difficulty falling sleep, dyspareunia, history of infertility, nocturia, sexual dysfunction, sleep disturbances, urinary incontinence, urinary urgency, vaginal discharge and vaginal itching. Diet - keto diet. Only for her grandsons birthday. The patient states her exercise level is minimal - 2 days a week and she does do exercises at home with hand weights and squats.   The patient's tobacco  use is:  Social History   Tobacco Use  Smoking Status Never  Smokeless Tobacco Never   She has been exposed to passive smoke. The patient's alcohol use is:  Social History   Substance and Sexual Activity  Alcohol Use Yes   Alcohol/week: 0.0 standard drinks   Comment: occasional   Additional information: Last pap 05/25/2021, next one scheduled for 05/25/2024.    Review of Systems  Constitutional: Negative.   HENT: Negative.    Eyes: Negative.   Respiratory: Negative.    Cardiovascular: Negative.   Gastrointestinal: Negative.   Endocrine: Negative.    Genitourinary: Negative.   Musculoskeletal: Negative.   Skin: Negative.   Allergic/Immunologic: Negative.   Neurological: Negative.   Hematological: Negative.   Psychiatric/Behavioral: Negative.      Today's Vitals   01/09/22 1504  BP: 128/82  Pulse: 95  Temp: 97.6 F (36.4 C)  Weight: (!) 349 lb 9.6 oz (158.6 kg)  Height: $Remove'5\' 4"'GpSdzPC$  (1.626 m)  PainSc: 0-No pain   Body mass index is 60.01 kg/m.   Objective:  Physical Exam Vitals reviewed.  Constitutional:      General: She is not in acute distress.    Appearance: Normal appearance. She is well-developed. She is obese.  HENT:     Head: Normocephalic and atraumatic.     Right Ear: Hearing, tympanic membrane, ear canal and external ear normal. There is no impacted cerumen.     Left Ear: Hearing, tympanic membrane, ear canal and external ear normal. There is no impacted cerumen.     Nose:     Comments: Deferred - masked    Mouth/Throat:     Comments: Deferred - masked Eyes:     General: Lids are normal.     Extraocular Movements: Extraocular movements intact.     Conjunctiva/sclera: Conjunctivae normal.     Pupils: Pupils are equal, round, and reactive to light.     Funduscopic exam:    Right eye: No papilledema.        Left eye: No papilledema.  Neck:     Thyroid: No thyroid mass.     Vascular: No carotid bruit.  Cardiovascular:     Rate and Rhythm: Normal rate and regular rhythm.     Pulses: Normal pulses.     Heart sounds: Normal heart sounds. No murmur heard. Pulmonary:     Effort: Pulmonary effort is normal.     Breath sounds: Normal breath sounds.  Chest:     Chest wall: No mass.  Breasts:    Tanner Score is 5.     Right: Normal. No mass or tenderness.     Left: Normal. No mass or tenderness.  Abdominal:     General: Abdomen is flat. Bowel sounds are normal. There is no distension.     Palpations: Abdomen is soft.     Tenderness: There is no abdominal tenderness.  Genitourinary:    Rectum: Guaiac result  negative.  Musculoskeletal:        General: No swelling. Normal range of motion.     Cervical back: Full passive range of motion without pain, normal range of motion and neck supple.     Right lower leg: No edema.     Left lower leg: No edema.  Lymphadenopathy:     Upper Body:     Right upper body: No supraclavicular, axillary or pectoral adenopathy.     Left upper body: No supraclavicular, axillary or pectoral adenopathy.  Skin:    General:  Skin is warm and dry.     Capillary Refill: Capillary refill takes less than 2 seconds.  Neurological:     General: No focal deficit present.     Mental Status: She is alert and oriented to person, place, and time.     Cranial Nerves: No cranial nerve deficit.     Sensory: No sensory deficit.  Psychiatric:        Mood and Affect: Mood normal.        Behavior: Behavior normal.        Thought Content: Thought content normal.        Judgment: Judgment normal.        Assessment And Plan:     1. Encounter for annual physical exam Behavior modifications discussed and diet history reviewed.   Pt will continue to exercise regularly and modify diet with low GI, plant based foods and decrease intake of processed foods.  Recommend intake of daily multivitamin, Vitamin D, and calcium.  Recommend mammogram (up to date) for preventive screenings, as well as recommend immunizations that include influenza, TDAP. Declines covid booster  2. Class 3 severe obesity due to excess calories with body mass index (BMI) of 60.0 to 69.9 in adult, unspecified whether serious comorbidity present (HCC) Chronic Discussed healthy diet and regular exercise options  Encouraged to exercise at least 150 minutes per week with 2 days of strength training  3. Type 2 diabetes mellitus with obesity ([place]) Comments: She is tolerating well, will increase to 12.5 mg weekly. Will check HgbA1c. Continue with healthy diet and regular exercise with goal of 150 minutes weekly - POCT  Urinalysis Dipstick (81002) - Microalbumin / creatinine urine ratio - Hemoglobin A1c - Lipid panel - blood glucose meter kit and supplies KIT; Use up to four times daily as directed.  Dispense: 1 each; Refill: 0 - tirzepatide (MOUNJARO) 12.5 MG/0.5ML Pen; Inject 12.5 mg into the skin once a week.  Dispense: 6 mL; Refill: 1  4. Generalized edema Comments: Improving and is now swelling without pitting, continue with as needed furosemide  5. Anxiety Comments: Continue Xanax as needed.  - ALPRAZolam (XANAX) 0.5 MG tablet; Take 1 tablet (0.5 mg total) by mouth 3 (three) times daily as needed for sleep or anxiety.  Dispense: 30 tablet; Refill: 0  6. Other long term (current) drug therapy - CBC   Patient was given opportunity to ask questions. Patient verbalized understanding of the plan and was able to repeat key elements of the plan. All questions were answered to their satisfaction.   [name], FNP   I, [name], FNP, have reviewed all documentation for this visit. The documentation on 01/09/22 for the exam, diagnosis, procedures, and orders are all accurate and complete.   THE PATIENT IS ENCOURAGED TO PRACTICE SOCIAL DISTANCING DUE TO THE COVID-19 PANDEMIC.

## 2022-01-09 NOTE — Patient Instructions (Signed)

## 2022-01-10 LAB — LIPID PANEL
Chol/HDL Ratio: 3.6 ratio (ref 0.0–4.4)
Cholesterol, Total: 186 mg/dL (ref 100–199)
HDL: 52 mg/dL (ref >39)
LDL Chol Calc (NIH): 118 mg/dL — ABNORMAL HIGH (ref 0–99)
Triglycerides: 87 mg/dL (ref 0–149)
VLDL Cholesterol Cal: 16 mg/dL (ref 5–40)

## 2022-01-10 LAB — MICROALBUMIN / CREATININE URINE RATIO
Creatinine, Urine: 104.2 mg/dL
Microalb/Creat Ratio: 11 mg/g creat (ref 0–29)
Microalbumin, Urine: 11 ug/mL

## 2022-01-10 LAB — CBC
Hematocrit: 37.4 % (ref 34.0–46.6)
Hemoglobin: 12.9 g/dL (ref 11.1–15.9)
MCH: 30.4 pg (ref 26.6–33.0)
MCHC: 34.5 g/dL (ref 31.5–35.7)
MCV: 88 fL (ref 79–97)
Platelets: 283 10*3/uL (ref 150–450)
RBC: 4.24 x10E6/uL (ref 3.77–5.28)
RDW: 15.3 % (ref 11.7–15.4)
WBC: 5.5 10*3/uL (ref 3.4–10.8)

## 2022-01-10 LAB — HEMOGLOBIN A1C
Est. average glucose Bld gHb Est-mCnc: 117 mg/dL
Hgb A1c MFr Bld: 5.7 % — ABNORMAL HIGH (ref 4.8–5.6)

## 2022-01-22 ENCOUNTER — Other Ambulatory Visit (HOSPITAL_BASED_OUTPATIENT_CLINIC_OR_DEPARTMENT_OTHER): Payer: 59

## 2022-01-22 DIAGNOSIS — R600 Localized edema: Secondary | ICD-10-CM | POA: Diagnosis not present

## 2022-01-22 DIAGNOSIS — R030 Elevated blood-pressure reading, without diagnosis of hypertension: Secondary | ICD-10-CM | POA: Diagnosis not present

## 2022-01-22 DIAGNOSIS — Z5181 Encounter for therapeutic drug level monitoring: Secondary | ICD-10-CM | POA: Diagnosis not present

## 2022-01-23 LAB — BASIC METABOLIC PANEL
BUN/Creatinine Ratio: 17 (ref 9–23)
BUN: 15 mg/dL (ref 6–24)
CO2: 19 mmol/L — ABNORMAL LOW (ref 20–29)
Calcium: 9.4 mg/dL (ref 8.7–10.2)
Chloride: 104 mmol/L (ref 96–106)
Creatinine, Ser: 0.87 mg/dL (ref 0.57–1.00)
Glucose: 104 mg/dL — ABNORMAL HIGH (ref 70–99)
Potassium: 4.2 mmol/L (ref 3.5–5.2)
Sodium: 138 mmol/L (ref 134–144)
eGFR: 85 mL/min/{1.73_m2} (ref >59)

## 2022-01-28 ENCOUNTER — Telehealth (HOSPITAL_BASED_OUTPATIENT_CLINIC_OR_DEPARTMENT_OTHER): Payer: Self-pay | Admitting: Cardiovascular Disease

## 2022-01-28 NOTE — Telephone Encounter (Signed)
Called to discuss rescheduling the Echocardiogram appt missed on 01/22/22---no answer and mail box is full---will continue to tay and reach patient by phone

## 2022-01-30 NOTE — Telephone Encounter (Signed)
Called to discuss rescheduling the Echo ordered by Dr. Benny Lennert answer and mail box is full

## 2022-02-12 ENCOUNTER — Other Ambulatory Visit (HOSPITAL_COMMUNITY): Payer: Self-pay

## 2022-02-13 ENCOUNTER — Other Ambulatory Visit (HOSPITAL_COMMUNITY): Payer: Self-pay

## 2022-02-15 ENCOUNTER — Encounter (HOSPITAL_BASED_OUTPATIENT_CLINIC_OR_DEPARTMENT_OTHER): Payer: Self-pay | Admitting: Cardiovascular Disease

## 2022-02-20 ENCOUNTER — Ambulatory Visit (INDEPENDENT_AMBULATORY_CARE_PROVIDER_SITE_OTHER): Payer: 59

## 2022-02-20 DIAGNOSIS — R030 Elevated blood-pressure reading, without diagnosis of hypertension: Secondary | ICD-10-CM | POA: Diagnosis not present

## 2022-02-20 DIAGNOSIS — R600 Localized edema: Secondary | ICD-10-CM | POA: Diagnosis not present

## 2022-02-20 LAB — ECHOCARDIOGRAM COMPLETE
AR max vel: 1.86 cm2
AV Area VTI: 2.05 cm2
AV Area mean vel: 2.02 cm2
AV Mean grad: 5 mmHg
AV Peak grad: 9.1 mmHg
Ao pk vel: 1.51 m/s
Area-P 1/2: 2.57 cm2
Calc EF: 59.3 %
S' Lateral: 4.25 cm
Single Plane A2C EF: 63.2 %
Single Plane A4C EF: 56.5 %

## 2022-02-26 ENCOUNTER — Ambulatory Visit (HOSPITAL_BASED_OUTPATIENT_CLINIC_OR_DEPARTMENT_OTHER): Payer: 59 | Admitting: Family

## 2022-03-15 ENCOUNTER — Ambulatory Visit (HOSPITAL_BASED_OUTPATIENT_CLINIC_OR_DEPARTMENT_OTHER): Payer: 59 | Admitting: Family

## 2022-04-04 ENCOUNTER — Other Ambulatory Visit (HOSPITAL_COMMUNITY): Payer: Self-pay

## 2022-04-18 ENCOUNTER — Other Ambulatory Visit: Payer: Self-pay

## 2022-04-18 ENCOUNTER — Other Ambulatory Visit (HOSPITAL_COMMUNITY): Payer: Self-pay

## 2022-04-18 ENCOUNTER — Encounter: Payer: Self-pay | Admitting: Nurse Practitioner

## 2022-04-18 MED ORDER — MOUNJARO 15 MG/0.5ML ~~LOC~~ SOAJ: 15.0000 mg | SUBCUTANEOUS | 1 refills | Status: DC

## 2022-04-18 MED ORDER — MOUNJARO 15 MG/0.5ML ~~LOC~~ SOAJ
15.0000 mg | SUBCUTANEOUS | 1 refills | Status: DC
Start: 2022-04-18 — End: 2022-04-19
  Filled 2022-04-18: qty 6, 84d supply, fill #0

## 2022-04-18 NOTE — Telephone Encounter (Signed)
You can send the increased dose with a 90 day supply

## 2022-04-19 ENCOUNTER — Other Ambulatory Visit (HOSPITAL_COMMUNITY): Payer: Self-pay

## 2022-04-19 ENCOUNTER — Other Ambulatory Visit: Payer: Self-pay

## 2022-04-19 MED ORDER — MOUNJARO 15 MG/0.5ML ~~LOC~~ SOAJ
15.0000 mg | SUBCUTANEOUS | 1 refills | Status: DC
Start: 2022-04-19 — End: 2022-11-19
  Filled 2022-04-19 – 2022-05-20 (×4): qty 6, 84d supply, fill #0
  Filled 2022-06-12: qty 2, 28d supply, fill #0
  Filled 2022-07-09: qty 2, 28d supply, fill #1
  Filled 2022-09-02: qty 2, 28d supply, fill #2
  Filled 2022-10-14: qty 2, 28d supply, fill #3
  Filled 2022-11-16: qty 2, 28d supply, fill #4

## 2022-04-22 ENCOUNTER — Other Ambulatory Visit (HOSPITAL_COMMUNITY): Payer: Self-pay

## 2022-04-23 ENCOUNTER — Other Ambulatory Visit (HOSPITAL_COMMUNITY): Payer: Self-pay

## 2022-04-24 ENCOUNTER — Other Ambulatory Visit (HOSPITAL_COMMUNITY): Payer: Self-pay

## 2022-05-14 ENCOUNTER — Encounter: Payer: Self-pay | Admitting: Nurse Practitioner

## 2022-05-14 ENCOUNTER — Ambulatory Visit: Payer: 59 | Admitting: Nurse Practitioner

## 2022-05-14 VITALS — BP 138/68 | HR 93 | Temp 98.0°F | Ht 64.0 in | Wt 322.2 lb

## 2022-05-14 DIAGNOSIS — R062 Wheezing: Secondary | ICD-10-CM

## 2022-05-14 DIAGNOSIS — E1169 Type 2 diabetes mellitus with other specified complication: Secondary | ICD-10-CM | POA: Diagnosis not present

## 2022-05-14 DIAGNOSIS — E669 Obesity, unspecified: Secondary | ICD-10-CM

## 2022-05-14 DIAGNOSIS — Z2821 Immunization not carried out because of patient refusal: Secondary | ICD-10-CM

## 2022-05-14 MED ORDER — ALBUTEROL SULFATE HFA 108 (90 BASE) MCG/ACT IN AERS
2.0000 | INHALATION_SPRAY | Freq: Four times a day (QID) | RESPIRATORY_TRACT | 2 refills | Status: DC | PRN
  Filled 2022-05-21 – 2022-06-12 (×2): qty 6.7, 25d supply, fill #0
  Filled 2022-07-12: qty 6.7, 25d supply, fill #1
  Filled 2022-12-13: qty 6.7, 25d supply, fill #2

## 2022-05-14 NOTE — Patient Instructions (Signed)

## 2022-05-14 NOTE — Progress Notes (Signed)
Barnet Glasgow Martin,acting as a Education administrator for [name], FNP.,have documented all relevant documentation on the behalf of [name], FNP,as directed by  [name], FNP while in the presence of [name], [place].    Subjective:     [name] , female    DOB: Aug 23, 1980 , 41 y.o.   MRN: [id number]   Chief Complaint  Patient presents with   Diabetes   Hypertension    HPI  Patient presents today for DM, BP check. Patient states shes getting her flu shot with her work clinic. Continues with 12.5 mg of Mounjaro.   Wt Readings from Last 3 Encounters: 05/14/22 : (!) 322 lb 3.2 oz (146.1 kg) 01/09/22 : (!) 349 lb 9.6 oz (158.6 kg) 01/08/22 : (!) 355 lb 9.6 oz (161.3 kg)  Keto diet for 35 weeks. She has lost 80lbs since starting Keto diet. Exercising 2 times a week but will do arm weights. She will take Xanax approximately 1-2 months when has a "wave" of anxiety  Diabetes She presents for her follow-up diabetic visit. She has type 2 diabetes mellitus. Her disease course has been improving. There are no hypoglycemic associated symptoms. There are no diabetic associated symptoms. There are no hypoglycemic complications. There are no diabetic complications. Risk factors for coronary artery disease include obesity. Current diabetic treatment includes oral agent (monotherapy). When asked about meal planning, she reported none. She has not had a previous visit with a dietitian. She participates in exercise every other day. (Has been checking blood sugar sporadically does not remember readings)  Hypertension This is a chronic problem. The problem is controlled. Pertinent negatives include no anxiety. Risk factors for coronary artery disease include obesity.     Past Medical History:  Diagnosis Date   Benign essential hypertension    Dysuria    Elevated blood pressure reading 01/08/2022   Gastritis    Insomnia    Lower extremity edema 01/08/2022   Malaise and fatigue     Malignant essential hypertension    Morbid obesity (HCC)    Panic disorder    Vitamin D deficiency      Family History  Problem Relation Age of Onset   Kidney disease Father    Aneurysm Father    Migraines Sister    Kidney disease Sister    Kidney disease Paternal Grandmother    Heart disease Paternal Grandfather      Current Outpatient Medications:    ALPRAZolam (XANAX) 0.5 MG tablet, Take 1 tablet (0.5 mg total) by mouth 3 (three) times daily as needed for sleep or anxiety., Disp: 30 tablet, Rfl: 0   blood glucose meter kit and supplies KIT, Use up to four times daily as directed., Disp: 1 each, Rfl: 0   furosemide (LASIX) 20 MG tablet, Take 1 tablet (20 mg total) by mouth daily., Disp: 90 tablet, Rfl: 1   glucose blood test strip, Use up to four times daily as directed., Disp: 100 each, Rfl: 0   Lancets (FREESTYLE) lancets, Use up to four times daily as directed., Disp: 100 each, Rfl: 0   tirzepatide (MOUNJARO) 12.5 MG/0.5ML Pen, Inject 12.5 mg into the skin once a week., Disp: 6 mL, Rfl: 1   tirzepatide (MOUNJARO) 15 MG/0.5ML Pen, Inject 15 mg into the skin once a week., Disp: 6 mL, Rfl: 1   albuterol (VENTOLIN HFA) 108 (90 Base) MCG/ACT inhaler, Inhale 2 puffs into the lungs every 6 (six) hours as needed for wheezing or shortness of  breath., Disp: 8 g, Rfl: 2   No Known Allergies   Review of Systems  Constitutional: Negative.   HENT: Negative.    Eyes: Negative.   Respiratory: Negative.    Cardiovascular: Negative.   Gastrointestinal: Negative.   Neurological: Negative.   Psychiatric/Behavioral: Negative.       Today's Vitals   05/14/22 1442  BP: 138/68  Pulse: 93  Temp: 98 F (36.7 C)  TempSrc: Oral  Weight: (!) 322 lb 3.2 oz (146.1 kg)  Height: _0  (1.626 m)  PainSc: 0-No pain   Body mass index is 55.31 kg/m.   Objective:  Physical Exam Vitals reviewed.  Constitutional:      General: She is not in acute distress.    Appearance: Normal appearance.   Cardiovascular:     Rate and Rhythm: Normal rate and regular rhythm.     Pulses: Normal pulses.     Heart sounds: Normal heart sounds. No murmur heard. Pulmonary:     Effort: Pulmonary effort is normal. No respiratory distress.     Breath sounds: Normal breath sounds. No wheezing.  Musculoskeletal:        General: No swelling or tenderness.     Right lower leg: No edema.     Left lower leg: No edema.  Skin:    General: Skin is warm and dry.     Capillary Refill: Capillary refill takes less than 2 seconds.  Neurological:     General: No focal deficit present.     Mental Status: She is alert and oriented to person, place, and time.     Cranial Nerves: No cranial nerve deficit.     Motor: No weakness.  Psychiatric:        Mood and Affect: Mood normal.        Behavior: Behavior normal.        Thought Content: Thought content normal.        Judgment: Judgment normal.         Assessment And Plan:     1. Type 2 diabetes mellitus with obesity ([place]) Comments: HgbA1c is improving and she is tolerating Mounjaro well. She will get the 12 mg dose in October. Continue focusing on healthy diet and regular exercise - Hemoglobin A1c - BMP8+eGFR - Ambulatory referral to Ophthalmology  2. Wheezing Comments: Refilled her albuterol, no wheezing noted on exam - albuterol (VENTOLIN HFA) 108 (90 Base) MCG/ACT inhaler; Inhale 2 puffs into the lungs every 6 (six) hours as needed for wheezing or shortness of breath.  Dispense: 8 g; Refill: 2  3. Immunization declined Patient declined influenza vaccination at this time. Patient is aware that influenza vaccine prevents illness in 70% of healthy people, and reduces hospitalizations to 30-70% in elderly. This vaccine is recommended annually. Education has been provided regarding the importance of this vaccine but patient still declined. Advised may receive this vaccine at local pharmacy or Health Dept.or vaccine clinic. Aware to provide a copy of the  vaccination record if obtained from local pharmacy or Health Dept.  Pt is willing to accept risk associated with refusing vaccination.    Patient was given opportunity to ask questions. Patient verbalized understanding of the plan and was able to repeat key elements of the plan. All questions were answered to their satisfaction.  [name], FNP   I, [name], FNP, have reviewed all documentation for this visit. The documentation on 05/14/22 for the exam, diagnosis, procedures, and orders are all accurate and complete.  IF YOU HAVE BEEN REFERRED TO A SPECIALIST, IT MAY TAKE 1-2 WEEKS TO SCHEDULE/PROCESS THE REFERRAL. IF YOU HAVE NOT HEARD FROM US/SPECIALIST IN TWO WEEKS, PLEASE GIVE [place] A CALL AT [phone].   THE PATIENT IS ENCOURAGED TO PRACTICE SOCIAL DISTANCING DUE TO THE COVID-19 PANDEMIC.

## 2022-05-15 LAB — BMP8+EGFR
BUN/Creatinine Ratio: 16 (ref 9–23)
BUN: 14 mg/dL (ref 6–24)
CO2: 18 mmol/L — ABNORMAL LOW (ref 20–29)
Calcium: 9.4 mg/dL (ref 8.7–10.2)
Chloride: 99 mmol/L (ref 96–106)
Creatinine, Ser: 0.86 mg/dL (ref 0.57–1.00)
Glucose: 83 mg/dL (ref 70–99)
Potassium: 4.5 mmol/L (ref 3.5–5.2)
Sodium: 135 mmol/L (ref 134–144)
eGFR: 86 mL/min/{1.73_m2} (ref >59)

## 2022-05-15 LAB — HEMOGLOBIN A1C
Est. average glucose Bld gHb Est-mCnc: 114 mg/dL
Hgb A1c MFr Bld: 5.6 % (ref 4.8–5.6)

## 2022-05-20 ENCOUNTER — Other Ambulatory Visit (HOSPITAL_COMMUNITY): Payer: Self-pay

## 2022-05-21 ENCOUNTER — Other Ambulatory Visit (HOSPITAL_COMMUNITY): Payer: Self-pay

## 2022-06-06 ENCOUNTER — Other Ambulatory Visit (HOSPITAL_COMMUNITY): Payer: Self-pay

## 2022-06-12 ENCOUNTER — Other Ambulatory Visit (HOSPITAL_COMMUNITY): Payer: Self-pay

## 2022-06-13 ENCOUNTER — Other Ambulatory Visit (HOSPITAL_COMMUNITY): Payer: Self-pay

## 2022-07-10 ENCOUNTER — Other Ambulatory Visit (HOSPITAL_COMMUNITY): Payer: Self-pay

## 2022-07-12 ENCOUNTER — Other Ambulatory Visit (HOSPITAL_COMMUNITY): Payer: Self-pay

## 2022-07-15 ENCOUNTER — Ambulatory Visit (HOSPITAL_BASED_OUTPATIENT_CLINIC_OR_DEPARTMENT_OTHER): Payer: Self-pay | Admitting: Cardiovascular Disease

## 2022-08-08 ENCOUNTER — Other Ambulatory Visit: Payer: Self-pay | Admitting: Nurse Practitioner

## 2022-08-08 ENCOUNTER — Other Ambulatory Visit (HOSPITAL_COMMUNITY): Payer: Self-pay

## 2022-08-08 DIAGNOSIS — E1169 Type 2 diabetes mellitus with other specified complication: Secondary | ICD-10-CM

## 2022-08-08 MED ORDER — MOUNJARO 12.5 MG/0.5ML ~~LOC~~ SOAJ
12.5000 mg | SUBCUTANEOUS | 1 refills | Status: DC
  Filled 2022-08-08: qty 2, 28d supply, fill #0

## 2022-08-10 IMAGING — MG MM DIGITAL SCREENING BILAT W/ TOMO AND CAD
8 of 15 series · 8 of 40 positions shown · non-contrast
Comparison: None.

CLINICAL DATA: Screening.

EXAM:
DIGITAL SCREENING BILATERAL MAMMOGRAM WITH TOMOSYNTHESIS AND CAD
TECHNIQUE: Bilateral screening digital craniocaudal and mediolateral oblique
mammograms were obtained. Bilateral screening digital breast
tomosynthesis was performed. The images were evaluated with
computer-aided detection.

[L XCCL synth-2D]
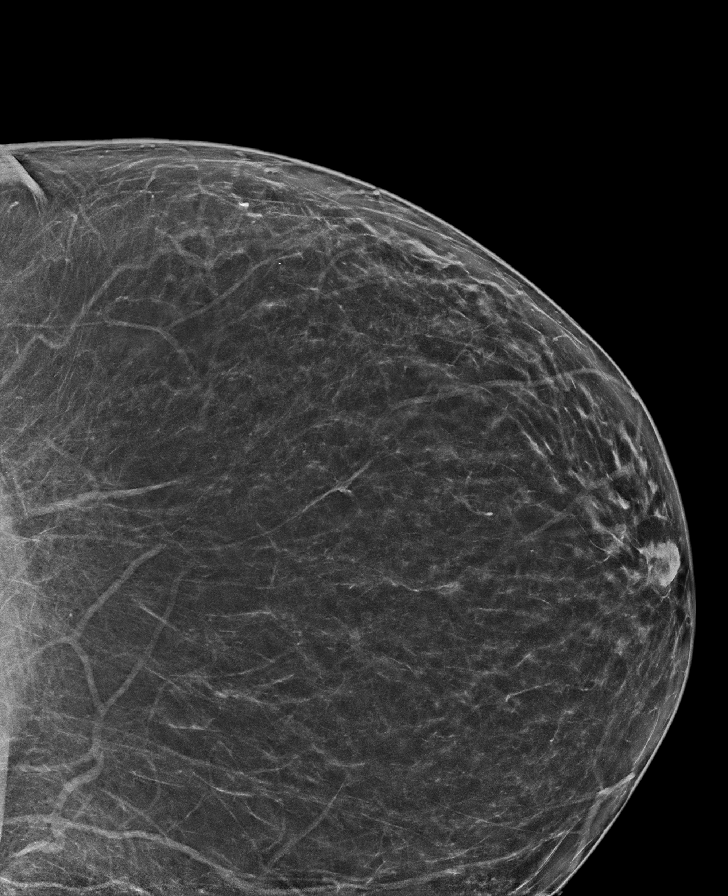

[L CC synth-2D]
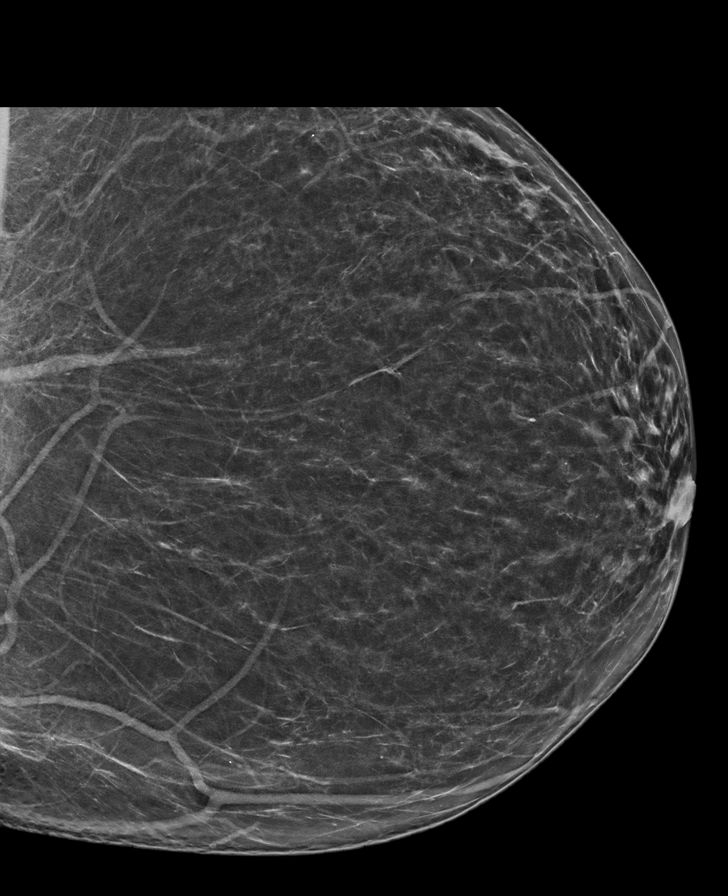

[L MLO synth-2D (1 of 2)]
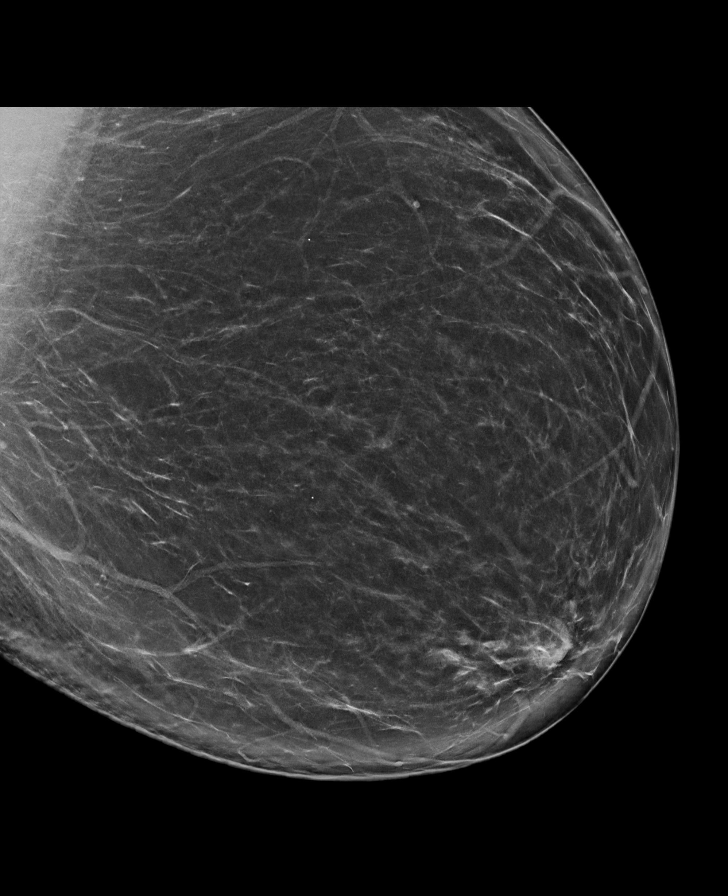

[L MLO synth-2D (2 of 2)]
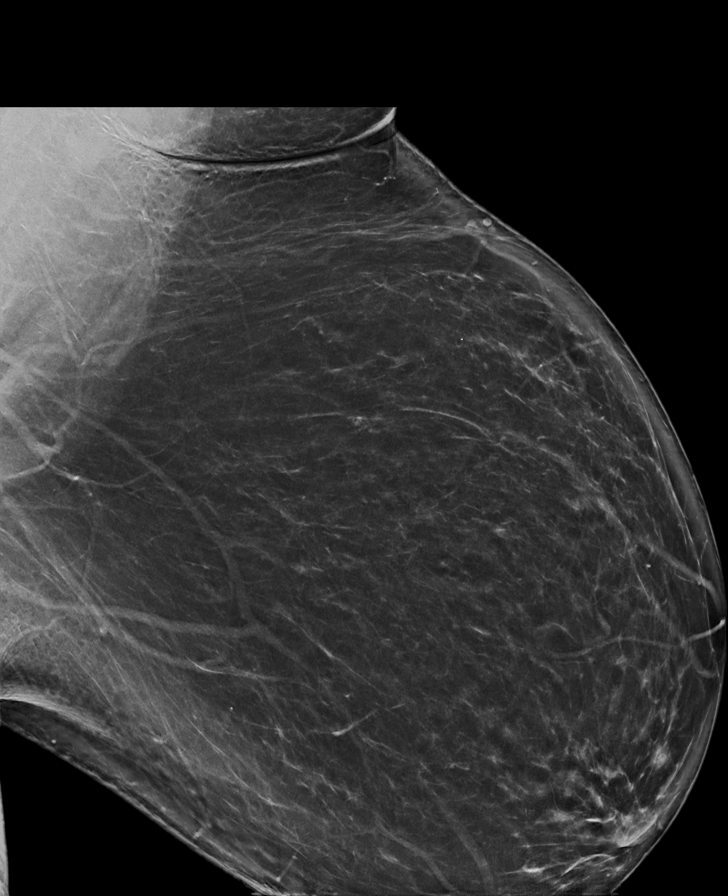

[R MLO synth-2D]
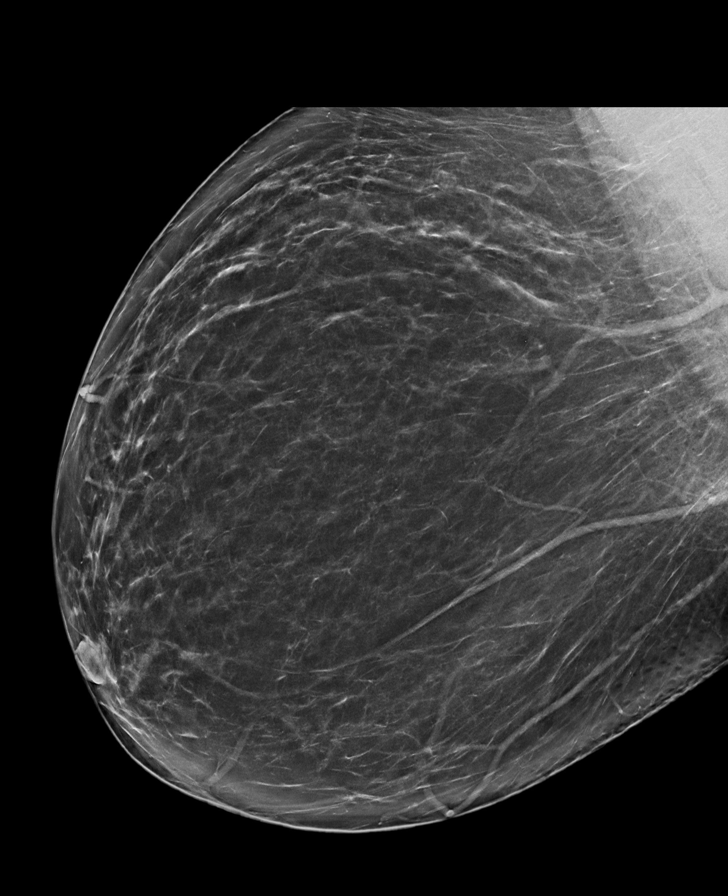

[R XCCL synth-2D]
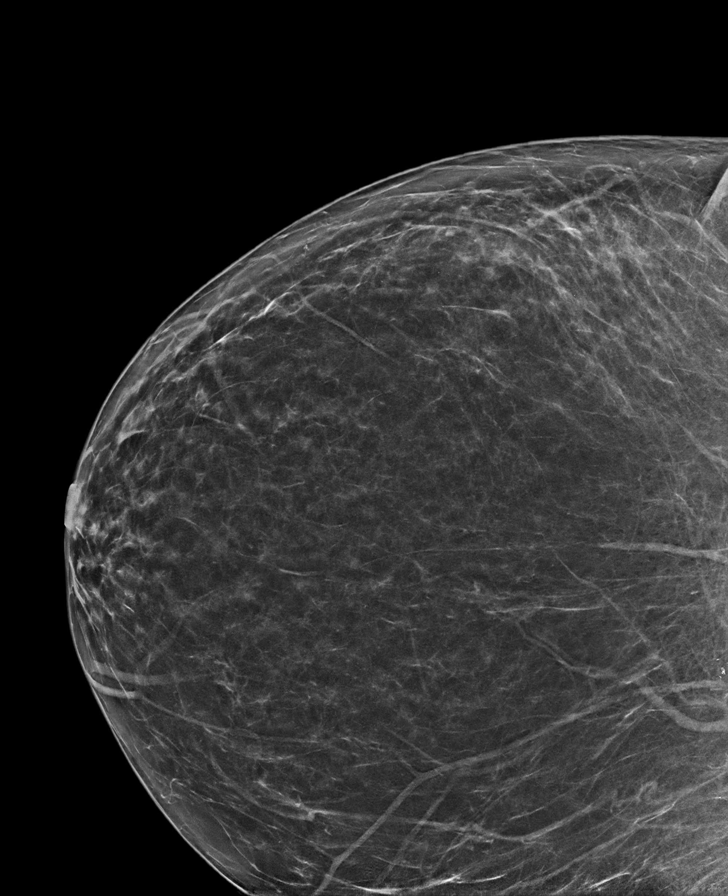

[R CC synth-2D]
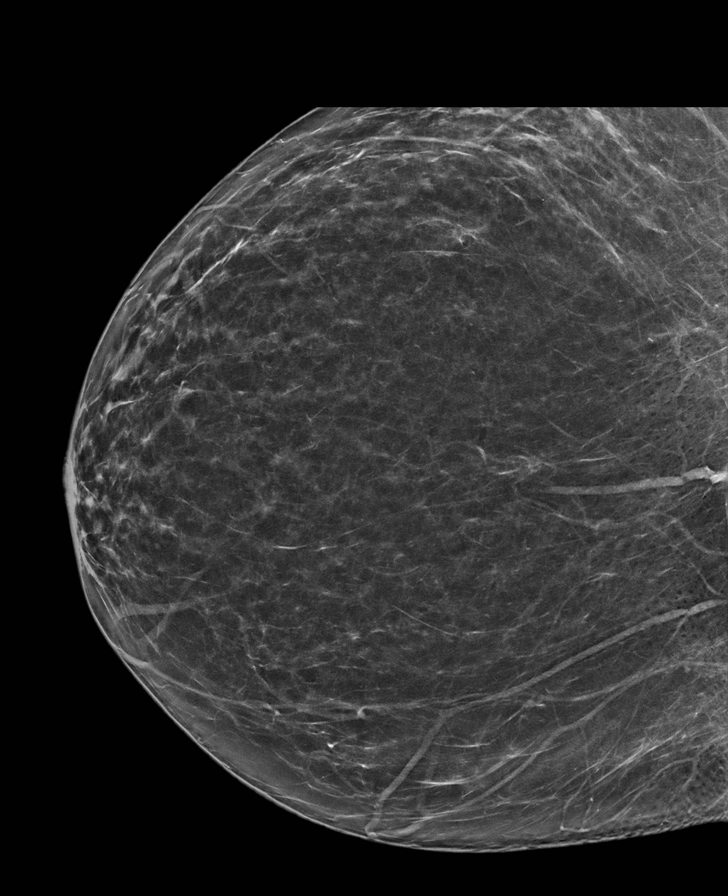

[R XCCL tomo · tomo slice 55/81.0]
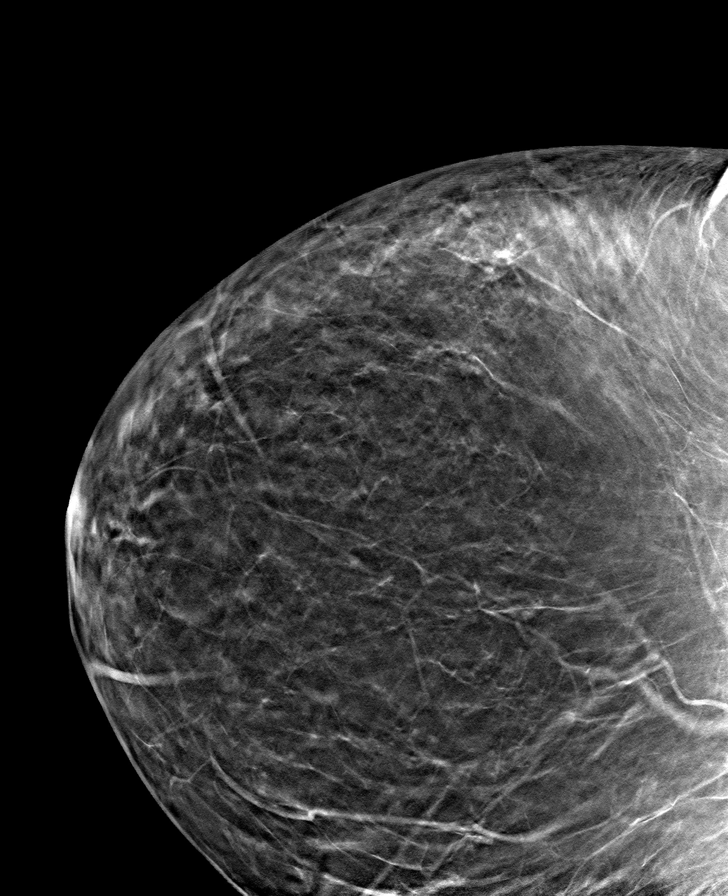

[8 of 40 positions shown; findings below may reference images not displayed]

ACR Breast Density Category b: There are scattered areas of
fibroglandular density.
FINDINGS: There are no findings suspicious for malignancy.
IMPRESSION: No mammographic evidence of malignancy. A result letter of this
screening mammogram will be mailed directly to the patient.

RECOMMENDATION:
Screening mammogram in one year. (Code:XG-X-X7B)

BI-RADS CATEGORY  1: Negative.

## 2022-09-02 ENCOUNTER — Other Ambulatory Visit (HOSPITAL_COMMUNITY): Payer: Self-pay

## 2022-09-24 ENCOUNTER — Other Ambulatory Visit: Payer: Self-pay | Admitting: Obstetrics

## 2022-09-24 DIAGNOSIS — Z1231 Encounter for screening mammogram for malignant neoplasm of breast: Secondary | ICD-10-CM

## 2022-10-01 DIAGNOSIS — E119 Type 2 diabetes mellitus without complications: Secondary | ICD-10-CM | POA: Diagnosis not present

## 2022-10-01 LAB — HM DIABETES EYE EXAM

## 2022-10-03 IMAGING — CR DG CHEST 2V
2 series · 2 of 2 positions shown · non-contrast
Comparison: None.

CLINICAL DATA: Shortness of breath and weight gain over 2 months

EXAM:
CHEST - 2 VIEW

[chest pa]
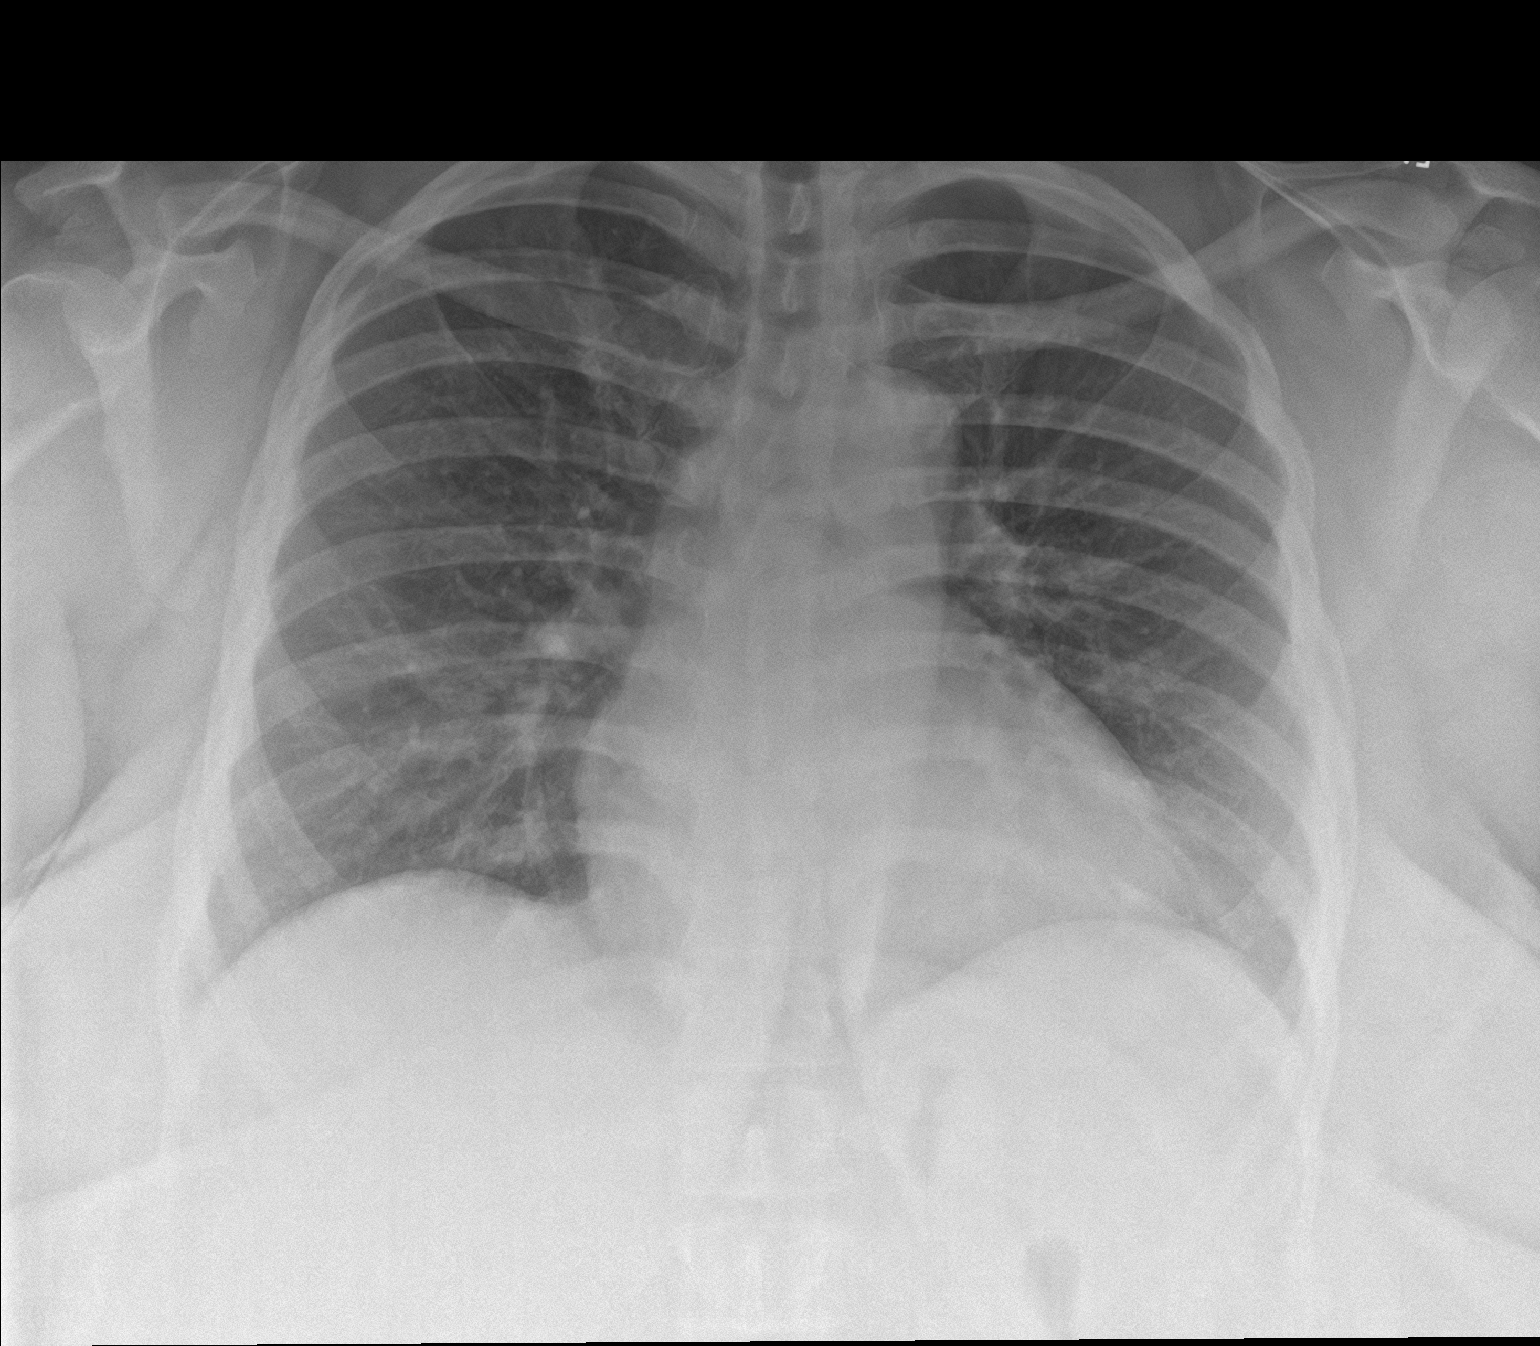

[chest lat]
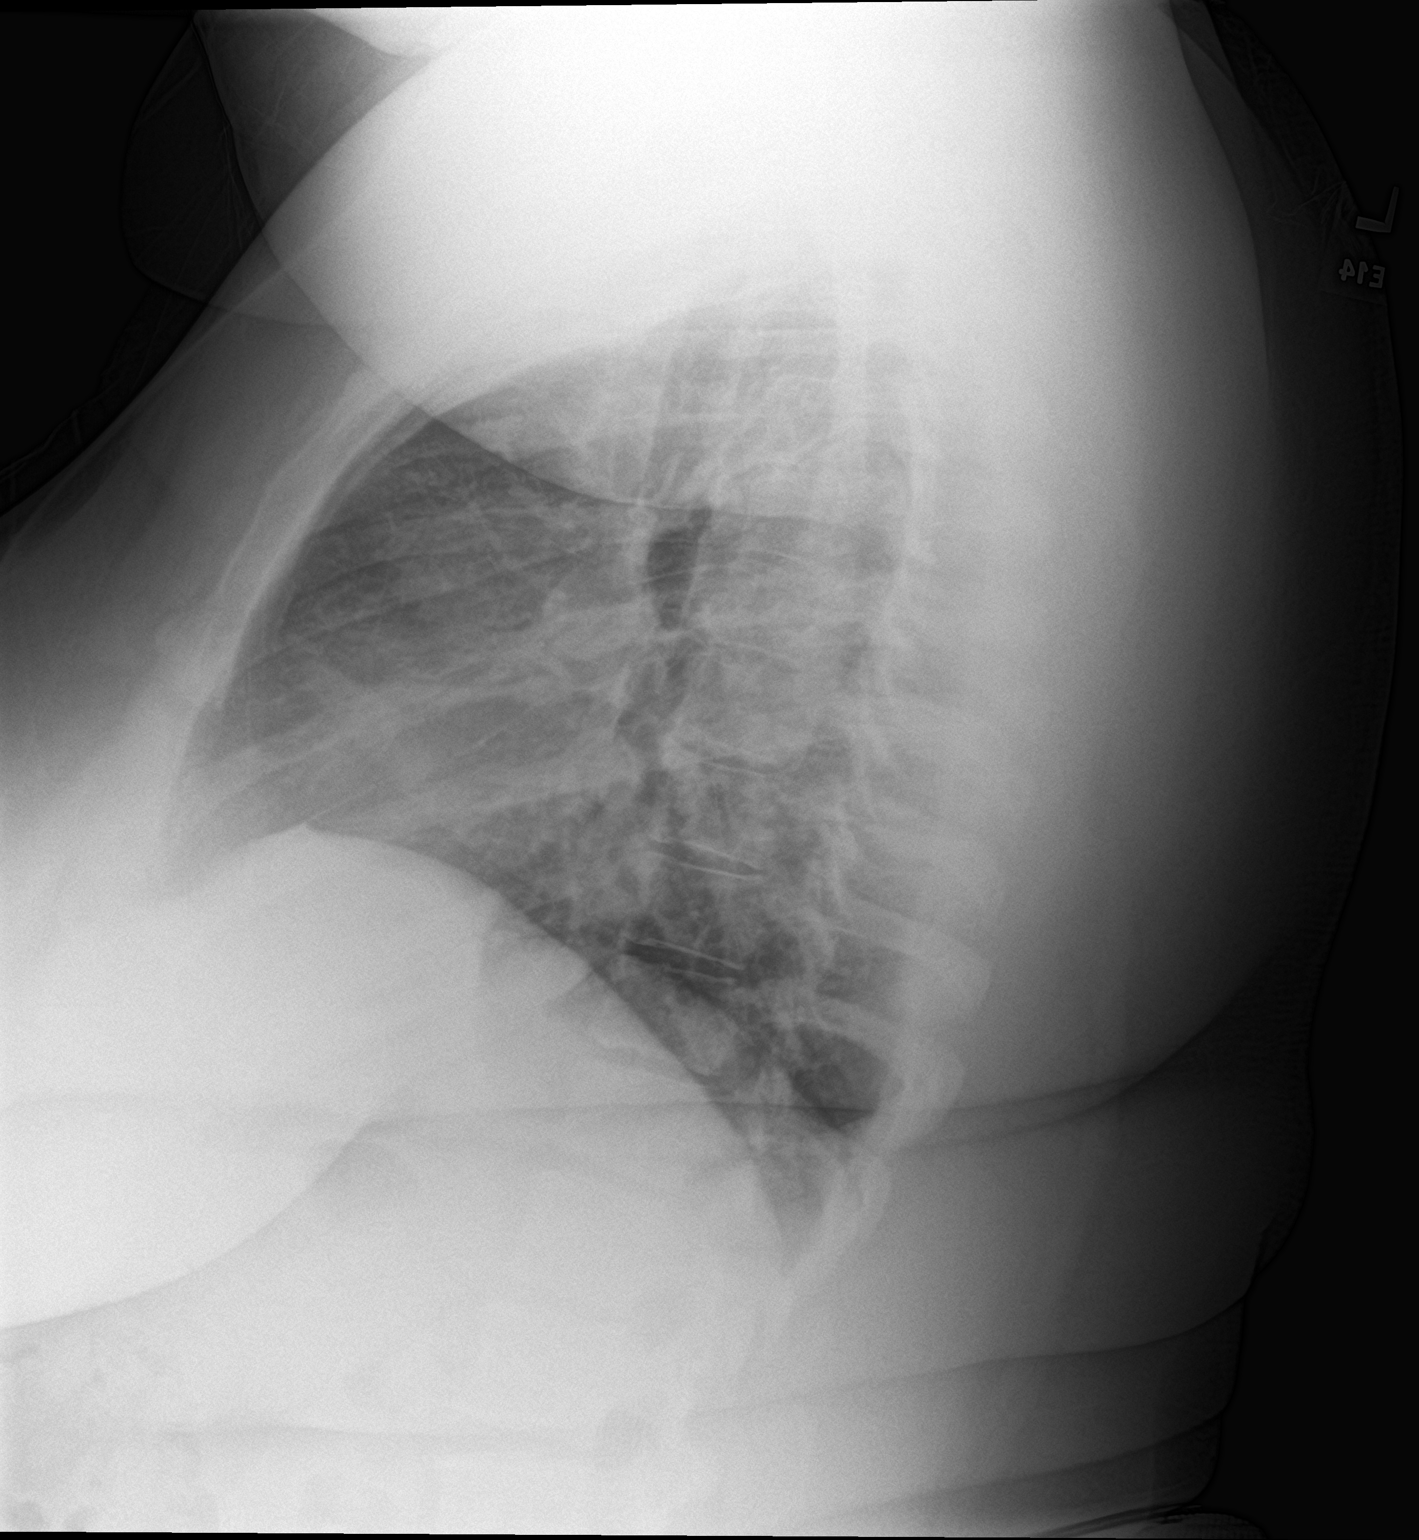

[2 of 2 positions shown; findings below may reference images not displayed]

FINDINGS: Midline trachea. Borderline cardiomegaly. Mediastinal contours
otherwise within normal limits. No pleural effusion or pneumothorax.
Clear lungs.
IMPRESSION: No active cardiopulmonary disease.

## 2022-10-31 ENCOUNTER — Ambulatory Visit: Payer: 59 | Admitting: Obstetrics

## 2022-11-06 ENCOUNTER — Ambulatory Visit
Admission: RE | Admit: 2022-11-06 | Discharge: 2022-11-06 | Disposition: A | Discharge status: Home / Self Care | Payer: 59 | Source: Ambulatory Visit | Attending: Obstetrics | Admitting: Obstetrics

## 2022-11-06 DIAGNOSIS — Z1231 Encounter for screening mammogram for malignant neoplasm of breast: Secondary | ICD-10-CM | POA: Diagnosis not present

## 2022-11-18 ENCOUNTER — Encounter: Payer: Self-pay | Admitting: Nurse Practitioner

## 2022-11-18 ENCOUNTER — Other Ambulatory Visit (HOSPITAL_COMMUNITY): Payer: Self-pay

## 2022-11-19 ENCOUNTER — Other Ambulatory Visit (HOSPITAL_COMMUNITY): Payer: Self-pay

## 2022-11-19 ENCOUNTER — Other Ambulatory Visit: Payer: Self-pay | Admitting: Nurse Practitioner

## 2022-11-19 ENCOUNTER — Encounter: Payer: Self-pay | Admitting: Nurse Practitioner

## 2022-11-19 MED ORDER — MOUNJARO 10 MG/0.5ML ~~LOC~~ SOAJ
10.0000 mg | SUBCUTANEOUS | 1 refills | Status: DC
  Filled 2022-11-19: qty 2, 28d supply, fill #0
  Filled 2022-11-19: qty 6, 84d supply, fill #0
  Filled 2022-12-13: qty 2, 28d supply, fill #1

## 2022-12-10 ENCOUNTER — Ambulatory Visit (INDEPENDENT_AMBULATORY_CARE_PROVIDER_SITE_OTHER): Payer: 59 | Admitting: Obstetrics

## 2022-12-10 ENCOUNTER — Other Ambulatory Visit (HOSPITAL_COMMUNITY)
Admission: RE | Admit: 2022-12-10 | Discharge: 2022-12-10 | Disposition: A | Discharge status: Home / Self Care | Payer: 59 | Source: Ambulatory Visit | Attending: Obstetrics | Admitting: Obstetrics

## 2022-12-10 ENCOUNTER — Encounter: Payer: Self-pay | Admitting: Obstetrics

## 2022-12-10 VITALS — BP 150/90 | HR 87 | Ht 65.0 in | Wt 297.6 lb

## 2022-12-10 DIAGNOSIS — Z113 Encounter for screening for infections with a predominantly sexual mode of transmission: Secondary | ICD-10-CM | POA: Diagnosis not present

## 2022-12-10 DIAGNOSIS — N898 Other specified noninflammatory disorders of vagina: Secondary | ICD-10-CM | POA: Insufficient documentation

## 2022-12-10 DIAGNOSIS — N3281 Overactive bladder: Secondary | ICD-10-CM | POA: Diagnosis not present

## 2022-12-10 DIAGNOSIS — Z01419 Encounter for gynecological examination (general) (routine) without abnormal findings: Secondary | ICD-10-CM | POA: Insufficient documentation

## 2022-12-10 DIAGNOSIS — N3941 Urge incontinence: Secondary | ICD-10-CM

## 2022-12-10 NOTE — Progress Notes (Unsigned)
Pt presents for AEX.  Last PAP 05-25-21 Last mammogram 11-07-22 Requesting STD testing Pt c/o increased urinary urgency with incontinence episodes.

## 2022-12-10 NOTE — Progress Notes (Unsigned)
Subjective:        Dorothy Sullivan is a 42 y.o. female here for a routine exam.  Current complaints: Urinary urgency and incontinence..    Personal health questionnaire:  Is patient Ashkenazi Jewish, have a family history of breast and/or ovarian cancer: no Is there a family history of uterine cancer diagnosed at age < 60, gastrointestinal cancer, urinary tract cancer, family member who is a Personnel officer syndrome-associated carrier: no Is the patient overweight and hypertensive, family history of diabetes, personal history of gestational diabetes, preeclampsia or PCOS: no Is patient over 13, have PCOS,  family history of premature CHD under age 38, diabetes, smoke, have hypertension or peripheral artery disease:  no At any time, has a partner hit, kicked or otherwise hurt or frightened you?: no Over the past 2 weeks, have you felt down, depressed or hopeless?: no Over the past 2 weeks, have you felt little interest or pleasure in doing things?:no   Gynecologic History Patient's last menstrual period was 10/31/2022. Contraception: tubal ligation Last Pap: 2022. Results were: normal Last mammogram: 2024. Results were: normal  Obstetric History OB History  Gravida Para Term Preterm AB Living  SAB IAB Ectopic Multiple Live Births          1    # Outcome Date GA Lbr Len/2nd Weight Sex Delivery Anes PTL Lv  1 Term 02/19/99    F [id number]-[id number]   LIV    Past Medical History:  Diagnosis Date   Benign essential hypertension    Dysuria    Elevated blood pressure reading 01/08/2022   Gastritis    Insomnia    Lower extremity edema 01/08/2022   Malaise and fatigue    Malignant essential hypertension    Morbid obesity    Panic disorder    Vitamin D deficiency     Past Surgical History:  Procedure Laterality Date   CESAREAN SECTION     TONSILLECTOMY     TUBAL LIGATION       Current Outpatient Medications:    albuterol (VENTOLIN HFA) 108 (90 Base) MCG/ACT inhaler, Inhale  2 puffs into the lungs every 6 (six) hours as needed for wheezing or shortness of breath., Disp: 6.7 g, Rfl: 2   tirzepatide (MOUNJARO) 10 MG/0.5ML Pen, Inject 10 mg into the skin once a week., Disp: 6 mL, Rfl: 1   ALPRAZolam (XANAX) 0.5 MG tablet, Take 1 tablet (0.5 mg total) by mouth 3 (three) times daily as needed for sleep or anxiety. (Patient not taking: Reported on 12/10/2022), Disp: 30 tablet, Rfl: 0   blood glucose meter kit and supplies KIT, Use up to four times daily as directed. (Patient not taking: Reported on 12/10/2022), Disp: 1 each, Rfl: 0   furosemide (LASIX) 20 MG tablet, Take 1 tablet (20 mg total) by mouth daily. (Patient not taking: Reported on 12/10/2022), Disp: 90 tablet, Rfl: 1   glucose blood test strip, Use up to four times daily as directed. (Patient not taking: Reported on 12/10/2022), Disp: 100 each, Rfl: 0   Lancets (FREESTYLE) lancets, Use up to four times daily as directed. (Patient not taking: Reported on 12/10/2022), Disp: 100 each, Rfl: 0 No Known Allergies  Social History   Tobacco Use   Smoking status: Never   Smokeless tobacco: Never  Substance Use Topics   Alcohol use: Yes    Alcohol/week: 0.0 standard drinks of alcohol    Comment: occasional    Family History  Problem Relation Age of Onset   Kidney disease Father    Aneurysm Father    Migraines Sister    Kidney disease Sister    Kidney disease Paternal Grandmother    Heart disease Paternal Grandfather       Review of Systems  Constitutional: negative for fatigue and weight loss Respiratory: negative for cough and wheezing Cardiovascular: negative for chest pain, fatigue and palpitations Gastrointestinal: negative for abdominal pain and change in bowel habits Musculoskeletal:negative for myalgias Neurological: negative for gait problems and tremors Behavioral/Psych: negative for abusive relationship, depression Endocrine: negative for temperature intolerance    Genitourinary:negative for  abnormal menstrual periods, genital lesions, hot flashes, sexual problems and vaginal discharge Integument/breast: negative for breast lump, breast tenderness, nipple discharge and skin lesion(s)    Objective:       BP (!) 150/90   Pulse 87   Ht  (1.651 m)   Wt 297 lb 9.6 oz (135 kg)   LMP 10/31/2022   BMI 49.52 kg/m  General:   alert  Skin:   no rash or abnormalities  Lungs:   clear to auscultation bilaterally  Heart:   regular rate and rhythm, S1, S2 normal, no murmur, click, rub or gallop  Breasts:   normal without suspicious masses, skin or nipple changes or axillary nodes  Abdomen:  normal findings: no organomegaly, soft, non-tender and no hernia  Pelvis:  External genitalia: normal general appearance Urinary system: urethral meatus normal and bladder without fullness, nontender Vaginal: normal without tenderness, induration or masses Cervix: normal appearance Adnexa: normal bimanual exam Uterus: anteverted and non-tender, normal size   Lab Review Urine pregnancy test Labs reviewed yes Radiologic studies reviewed  yes  I have spent a total of 20 minutes of face-to-face time excluding clinical staff time, reviewing notes and preparing to see patient, ordering tests and/or medications, and counseling the patient.   Assessment:    1. Encounter for routine gynecological examination with Papanicolaou smear of cervix Rx: - Cytology - PAP( [place])  2. Vaginal discharge Rx: - Cervicovaginal ancillary only( [place])  3. Screening for STD (sexually transmitted disease) Rx: - HIV antibody (with reflex) - Hepatitis C Antibody - Hepatitis B Surface AntiGEN - RPR  4. OAB (overactive bladder) Rx: - Ambulatory referral to Urology  5. Urge incontinence Rx: - Urine Culture     Plan:    Education reviewed: calcium supplements, depression evaluation, low fat, low cholesterol diet, safe sex/STD prevention, self breast exams, and weight bearing  exercise. Follow up in: 1 year.   No orders of the defined types were placed in this encounter.  Orders Placed This Encounter  Procedures   HIV antibody (with reflex)   Hepatitis C Antibody   Hepatitis B Surface AntiGEN   RPR   Ambulatory referral to Urology    Referral Priority:   Routine    Referral Type:   Consultation    Referral Reason:   Specialty Services Required    Requested Specialty:   Urology    Number of Visits Requested:   1    Brock Bad, MD 12/10/2022 3:38 PM

## 2022-12-11 LAB — RPR: RPR Ser Ql: NONREACTIVE

## 2022-12-11 LAB — CERVICOVAGINAL ANCILLARY ONLY
Bacterial Vaginitis (gardnerella): NEGATIVE
Candida Glabrata: NEGATIVE
Candida Vaginitis: NEGATIVE
Chlamydia: NEGATIVE
Comment: NEGATIVE
Comment: NEGATIVE
Comment: NEGATIVE
Comment: NEGATIVE
Comment: NEGATIVE
Comment: NORMAL
Neisseria Gonorrhea: NEGATIVE
Trichomonas: POSITIVE — AB

## 2022-12-11 LAB — HEPATITIS B SURFACE ANTIGEN: Hepatitis B Surface Ag: NEGATIVE

## 2022-12-11 LAB — HIV ANTIBODY (ROUTINE TESTING W REFLEX): HIV Screen 4th Generation wRfx: NONREACTIVE

## 2022-12-11 LAB — HEPATITIS C ANTIBODY: Hep C Virus Ab: NONREACTIVE

## 2022-12-13 ENCOUNTER — Other Ambulatory Visit (HOSPITAL_COMMUNITY): Payer: Self-pay

## 2022-12-13 ENCOUNTER — Other Ambulatory Visit: Payer: Self-pay | Admitting: Obstetrics

## 2022-12-13 ENCOUNTER — Other Ambulatory Visit (HOSPITAL_BASED_OUTPATIENT_CLINIC_OR_DEPARTMENT_OTHER): Payer: Self-pay | Admitting: Cardiovascular Disease

## 2022-12-13 ENCOUNTER — Other Ambulatory Visit: Payer: Self-pay

## 2022-12-13 ENCOUNTER — Telehealth: Payer: Self-pay

## 2022-12-13 DIAGNOSIS — M7989 Other specified soft tissue disorders: Secondary | ICD-10-CM

## 2022-12-13 DIAGNOSIS — A599 Trichomoniasis, unspecified: Secondary | ICD-10-CM

## 2022-12-13 LAB — CYTOLOGY - PAP
Comment: NEGATIVE
Diagnosis: NEGATIVE
High risk HPV: NEGATIVE

## 2022-12-13 LAB — URINE CULTURE

## 2022-12-13 MED ORDER — METRONIDAZOLE 500 MG PO TABS
500.0000 mg | ORAL_TABLET | Freq: Two times a day (BID) | ORAL | 2 refills | Status: DC
Start: 2022-12-13 — End: 2023-01-15
  Filled 2022-12-13: qty 14, 7d supply, fill #0

## 2022-12-13 NOTE — Telephone Encounter (Signed)
Spoke with patient --she starts she accidentally hit "refill" for this medication and she does not need any more of these.  She does not wish to schedule an appointment at this time

## 2022-12-13 NOTE — Telephone Encounter (Signed)
S/w pt and advised of results, treatment plan, and rx 

## 2022-12-13 NOTE — Telephone Encounter (Signed)
Please call pt to schedule overdue 6 month follow-up with Dr. Duke Salvia or APP for refills. Pt last seen 12/2021, was supposed to follow-up 6 months later.

## 2022-12-15 ENCOUNTER — Other Ambulatory Visit: Payer: Self-pay | Admitting: Obstetrics

## 2022-12-15 DIAGNOSIS — N390 Urinary tract infection, site not specified: Secondary | ICD-10-CM

## 2022-12-15 MED ORDER — AMOXICILLIN-POT CLAVULANATE 875-125 MG PO TABS
1.0000 | ORAL_TABLET | Freq: Two times a day (BID) | ORAL | 0 refills | Status: DC
Start: 2022-12-15 — End: 2023-01-15
  Filled 2022-12-15: qty 14, 7d supply, fill #0

## 2022-12-16 ENCOUNTER — Other Ambulatory Visit (HOSPITAL_COMMUNITY): Payer: Self-pay

## 2022-12-18 ENCOUNTER — Ambulatory Visit (INDEPENDENT_AMBULATORY_CARE_PROVIDER_SITE_OTHER): Payer: 59 | Admitting: Urology

## 2022-12-18 ENCOUNTER — Other Ambulatory Visit (HOSPITAL_COMMUNITY): Payer: Self-pay

## 2022-12-18 ENCOUNTER — Encounter: Payer: Self-pay | Admitting: Urology

## 2022-12-18 VITALS — BP 137/84 | HR 86 | Ht 65.0 in | Wt 298.0 lb

## 2022-12-18 DIAGNOSIS — N3281 Overactive bladder: Secondary | ICD-10-CM | POA: Diagnosis not present

## 2022-12-18 DIAGNOSIS — R350 Frequency of micturition: Secondary | ICD-10-CM | POA: Diagnosis not present

## 2022-12-18 DIAGNOSIS — A5901 Trichomonal vulvovaginitis: Secondary | ICD-10-CM

## 2022-12-18 LAB — URINALYSIS, ROUTINE W REFLEX MICROSCOPIC
Bilirubin, UA: NEGATIVE
Glucose, UA: NEGATIVE
Ketones, UA: NEGATIVE
Leukocytes,UA: NEGATIVE
Nitrite, UA: NEGATIVE
Protein,UA: NEGATIVE
RBC, UA: NEGATIVE
Specific Gravity, UA: 1.02 (ref 1.005–1.030)
Urobilinogen, Ur: 1 mg/dL (ref 0.2–1.0)
pH, UA: 6 (ref 5.0–7.5)

## 2022-12-18 NOTE — Progress Notes (Signed)
   Assessment: OAB Tricomonas vaginalis   Plan: Complete Rx of flagyl for trichomonas (partner to be treated as well) No need to take augmentin for UTI-  ua today clear Trial of solfenacin 10mg  daily FU 41mo  Chief Complaint: LUTS  History of Present Illness:  Dorothy Sullivan is a 42 y.o. female who is seen in consultation from Arnette Felts, FNP for evaluation of LUTS.  Patient had a recent visit with PCP and had testing done which included finding of vaginal trichomonas and also a urine culture was sent which came back showing Enterococcus.  There is no urinalysis done.  The patient denies any UTI symptoms.  Today her urinalysis is entirely clear.  Patient reports longstanding progressive lower urinary tract symptoms consistent with OAB.  She reports urinary frequency urgency as well as occasional episodes of urge incontinence.  She denies any stress incontinence.  She has no prior GU history.  She denies any menopausal symptoms.  She has had 1 child born via C-section.   Past Medical History:  Past Medical History:  Diagnosis Date   Benign essential hypertension    Dysuria    Elevated blood pressure reading 01/08/2022   Gastritis    Insomnia    Lower extremity edema 01/08/2022   Malaise and fatigue    Malignant essential hypertension    Morbid obesity (HCC)    Panic disorder    Vitamin D deficiency     Past Surgical History:  Past Surgical History:  Procedure Laterality Date   CESAREAN SECTION     TONSILLECTOMY     TUBAL LIGATION      Allergies:  No Known Allergies  Family History:  Family History  Problem Relation Age of Onset   Kidney disease Father    Aneurysm Father    Migraines Sister    Kidney disease Sister    Kidney disease Paternal Grandmother    Heart disease Paternal Grandfather     Social History:  Social History   Tobacco Use   Smoking status: Never   Smokeless tobacco: Never  Vaping Use   Vaping Use: Never used  Substance Use Topics    Alcohol use: Yes    Alcohol/week: 0.0 standard drinks of alcohol    Comment: occasional   Drug use: No    Review of symptoms:  Constitutional:  Negative for unexplained weight loss, night sweats, fever, chills ENT:  Negative for nose bleeds, sinus pain, painful swallowing CV:  Negative for chest pain, shortness of breath, exercise intolerance, palpitations, loss of consciousness Resp:  Negative for cough, wheezing, shortness of breath GI:  Negative for nausea, vomiting, diarrhea, bloody stools GU:  Positives noted in HPI; otherwise negative for gross hematuria, dysuria, urinary incontinence Neuro:  Negative for seizures, poor balance, limb weakness, slurred speech Psych:  Negative for lack of energy, depression, anxiety Endocrine:  Negative for polydipsia, polyuria, symptoms of hypoglycemia (dizziness, hunger, sweating) Hematologic:  Negative for anemia, purpura, petechia, prolonged or excessive bleeding, use of anticoagulants  Allergic:  Negative for difficulty breathing or choking as a result of exposure to anything; no shellfish allergy; no allergic response (rash/itch) to materials, foods  Physical exam: BP 137/84   Pulse 86   Ht 5\' 5"  (1.651 m)   Wt 298 lb (135.2 kg)   LMP 10/31/2022   BMI 49.59 kg/m  GENERAL APPEARANCE:  Well appearing, well developed, well nourished, NAD   Results: UA is negative

## 2022-12-20 ENCOUNTER — Other Ambulatory Visit: Payer: Self-pay

## 2022-12-20 ENCOUNTER — Other Ambulatory Visit (HOSPITAL_COMMUNITY): Payer: Self-pay

## 2022-12-20 ENCOUNTER — Encounter: Payer: Self-pay | Admitting: Urology

## 2022-12-20 MED ORDER — SOLIFENACIN SUCCINATE 10 MG PO TABS
10.0000 mg | ORAL_TABLET | Freq: Every day | ORAL | 2 refills | Status: AC
  Filled 2022-12-20: qty 30, 30d supply, fill #0
  Filled 2023-02-14: qty 30, 30d supply, fill #1
  Filled 2023-03-23: qty 30, 30d supply, fill #2

## 2022-12-24 ENCOUNTER — Other Ambulatory Visit (HOSPITAL_COMMUNITY): Payer: Self-pay

## 2023-01-15 ENCOUNTER — Encounter: Payer: Self-pay | Admitting: Nurse Practitioner

## 2023-01-15 ENCOUNTER — Ambulatory Visit (INDEPENDENT_AMBULATORY_CARE_PROVIDER_SITE_OTHER): Payer: 59 | Admitting: Nurse Practitioner

## 2023-01-15 ENCOUNTER — Other Ambulatory Visit (HOSPITAL_COMMUNITY): Payer: Self-pay

## 2023-01-15 VITALS — BP 132/70 | HR 72 | Temp 98.4°F | Ht 65.0 in | Wt 293.0 lb

## 2023-01-15 DIAGNOSIS — Z6841 Body Mass Index (BMI) 40.0 and over, adult: Secondary | ICD-10-CM | POA: Diagnosis not present

## 2023-01-15 DIAGNOSIS — F419 Anxiety disorder, unspecified: Secondary | ICD-10-CM | POA: Diagnosis not present

## 2023-01-15 DIAGNOSIS — E1169 Type 2 diabetes mellitus with other specified complication: Secondary | ICD-10-CM

## 2023-01-15 DIAGNOSIS — Z13220 Encounter for screening for lipoid disorders: Secondary | ICD-10-CM | POA: Diagnosis not present

## 2023-01-15 DIAGNOSIS — Z0000 Encounter for general adult medical examination without abnormal findings: Secondary | ICD-10-CM | POA: Diagnosis not present

## 2023-01-15 DIAGNOSIS — G4709 Other insomnia: Secondary | ICD-10-CM | POA: Diagnosis not present

## 2023-01-15 DIAGNOSIS — E669 Obesity, unspecified: Secondary | ICD-10-CM | POA: Diagnosis not present

## 2023-01-15 DIAGNOSIS — Z79899 Other long term (current) drug therapy: Secondary | ICD-10-CM | POA: Diagnosis not present

## 2023-01-15 DIAGNOSIS — Z2821 Immunization not carried out because of patient refusal: Secondary | ICD-10-CM

## 2023-01-15 DIAGNOSIS — W57XXXA Bitten or stung by nonvenomous insect and other nonvenomous arthropods, initial encounter: Secondary | ICD-10-CM

## 2023-01-15 MED ORDER — ALPRAZOLAM 0.5 MG PO TABS
0.5000 mg | ORAL_TABLET | Freq: Three times a day (TID) | ORAL | 0 refills | Status: DC | PRN
Start: 2023-01-15 — End: 2023-02-14
  Filled 2023-01-15: qty 30, 10d supply, fill #0

## 2023-01-15 MED ORDER — MOUNJARO 12.5 MG/0.5ML ~~LOC~~ SOAJ
12.5000 mg | SUBCUTANEOUS | 2 refills | Status: DC
Start: 2023-01-15 — End: 2023-04-16
  Filled 2023-01-15: qty 2, 28d supply, fill #0
  Filled 2023-02-14: qty 2, 28d supply, fill #1
  Filled 2023-03-23: qty 2, 28d supply, fill #2

## 2023-01-15 MED ORDER — RESTORA PO CAPS
1.0000 | ORAL_CAPSULE | Freq: Every day | ORAL | 5 refills | Status: DC
  Filled 2023-01-15 – 2023-04-16 (×3): qty 60, 60d supply, fill #0

## 2023-01-15 NOTE — Progress Notes (Signed)
Hershal Coria Martin,acting as a Neurosurgeon for Arnette Felts, FNP.,have documented all relevant documentation on the behalf of Arnette Felts, FNP,as directed by  Arnette Felts, FNP while in the presence of Arnette Felts, FNP.   Subjective:     [name] , female    DOB: 1981-07-06 , 42 y.o.   MRN: [id number]   Chief Complaint  Patient presents with   Annual Exam    HPI  Patient presents today for HM, patient reports compliance with medications and has no other concerns today. She has seen the Urologist - has overactive bladder. Started on a medication has a f/u first week in August  Wt Readings from Last 3 Encounters: 01/15/23 : 293 lb (132.9 kg) 12/18/22 : 298 lb (135.2 kg) 12/10/22 : 297 lb 9.6 oz (135 kg)  She feels like her weight is up 6 lbs in the last week. She is back on her intermittent fasting.   She is under increased stress with her daughter being attacked by her childs father and 2 other people. She is having difficulty with adjusting to these issues. Her daughter is not currently staying here in [place].       Past Medical History:  Diagnosis Date   Benign essential hypertension    Dysuria    Elevated blood pressure reading 01/08/2022   Gastritis    Insomnia    Lower extremity edema 01/08/2022   Malaise and fatigue    Malignant essential hypertension    Morbid obesity (HCC)    Panic disorder    Vitamin D deficiency      Family History  Problem Relation Age of Onset   Kidney disease Father    Aneurysm Father    Migraines Sister    Kidney disease Sister    Kidney disease Paternal Grandmother    Heart disease Paternal Grandfather      Current Outpatient Medications:    albuterol (VENTOLIN HFA) 108 (90 Base) MCG/ACT inhaler, Inhale 2 puffs into the lungs every 6 (six) hours as needed for wheezing or shortness of breath., Disp: 6.7 g, Rfl: 2   blood glucose meter kit and supplies KIT, Use up to four times daily as directed., Disp: 1 each,  Rfl: 0   furosemide (LASIX) 20 MG tablet, Take 1 tablet (20 mg total) by mouth daily., Disp: 90 tablet, Rfl: 1   glucose blood test strip, Use up to four times daily as directed., Disp: 100 each, Rfl: 0   Lancets (FREESTYLE) lancets, Use up to four times daily as directed., Disp: 100 each, Rfl: 0   Probiotic Product (RESTORA) CAPS, Take 1 capsule by mouth daily., Disp: 30 capsule, Rfl: 5   solifenacin (VESICARE) 10 MG tablet, Take 1 tablet (10 mg total) by mouth daily., Disp: 30 tablet, Rfl: 2   tirzepatide (MOUNJARO) 10 MG/0.5ML Pen, Inject 10 mg into the skin once a week., Disp: 6 mL, Rfl: 1   tirzepatide (MOUNJARO) 12.5 MG/0.5ML Pen, Inject 12.5 mg into the skin once a week., Disp: 2 mL, Rfl: 2   ALPRAZolam (XANAX) 0.5 MG tablet, Take 1 tablet (0.5 mg total) by mouth 3 (three) times daily as needed for sleep or anxiety., Disp: 30 tablet, Rfl: 0   No Known Allergies    The patient states she uses tubal ligation for birth control. Patient's last menstrual period was 12/25/2022.. Negative for Dysmenorrhea and Negative for Menorrhagia. Negative for: breast discharge, breast lump(s), breast pain and breast self exam. Associated symptoms include abnormal vaginal  bleeding. Pertinent negatives include abnormal bleeding (hematology), anxiety, decreased libido, depression, difficulty falling sleep, dyspareunia, history of infertility, nocturia, sexual dysfunction, sleep disturbances, urinary incontinence, urinary urgency, vaginal discharge and vaginal itching. Diet regular- continues on keto. The patient states her exercise level is moderate 20-30 minutes a day at least 5 days a week.   The patient's tobacco use is:  Social History   Tobacco Use  Smoking Status Never  Smokeless Tobacco Never   She has been exposed to passive smoke. The patient's alcohol use is:  Social History   Substance and Sexual Activity  Alcohol Use Yes   Alcohol/week: 0.0 standard drinks of alcohol   Comment: occasional   Additional information: Last pap 12/10/2022, next one scheduled for 12/09/2025.    Review of Systems  Constitutional: Negative.   HENT: Negative.    Eyes: Negative.   Respiratory: Negative.    Cardiovascular: Negative.   Gastrointestinal: Negative.   Endocrine: Negative.   Genitourinary: Negative.   Musculoskeletal: Negative.   Skin: Negative.   Allergic/Immunologic: Negative.   Neurological: Negative.   Hematological: Negative.   Psychiatric/Behavioral: Negative.       Today's Vitals   01/15/23 1510  BP: 132/70  Pulse: 72  Temp: 98.4 F (36.9 C)  TempSrc: Oral  Weight: 293 lb (132.9 kg)  Height: 5\' 5"  (1.651 m)  PainSc: 0-No pain   Body mass index is 48.76 kg/m.   Objective:  Physical Exam Vitals reviewed.  Constitutional:      General: She is not in acute distress.    Appearance: Normal appearance. She is well-developed. She is obese.  HENT:     Head: Normocephalic and atraumatic.     Right Ear: Hearing, tympanic membrane, ear canal and external ear normal. There is no impacted cerumen.     Left Ear: Hearing, tympanic membrane, ear canal and external ear normal. There is no impacted cerumen.     Nose: Nose normal.     Mouth/Throat:     Mouth: Mucous membranes are moist.  Eyes:     General: Lids are normal.     Extraocular Movements: Extraocular movements intact.     Conjunctiva/sclera: Conjunctivae normal.     Pupils: Pupils are equal, round, and reactive to light.     Funduscopic exam:    Right eye: No papilledema.        Left eye: No papilledema.  Neck:     Thyroid: No thyroid mass.     Vascular: No carotid bruit.  Cardiovascular:     Rate and Rhythm: Normal rate and regular rhythm.     Pulses: Normal pulses.     Heart sounds: Normal heart sounds. No murmur heard. Pulmonary:     Effort: Pulmonary effort is normal. No respiratory distress.     Breath sounds: Normal breath sounds. No wheezing.  Chest:     Chest wall: No mass.  Breasts:    Tanner  Score is 5.     Right: Normal. No mass or tenderness.     Left: Normal. No mass or tenderness.  Abdominal:     General: Abdomen is flat. Bowel sounds are normal. There is no distension.     Palpations: Abdomen is soft.     Tenderness: There is no abdominal tenderness.  Genitourinary:    Comments: NA - followed by GYN Musculoskeletal:        General: No swelling or tenderness. Normal range of motion.     Cervical back: Full passive range of motion  without pain, normal range of motion and neck supple.     Right lower leg: No edema.     Left lower leg: No edema.  Lymphadenopathy:     Upper Body:     Right upper body: No supraclavicular, axillary or pectoral adenopathy.     Left upper body: No supraclavicular, axillary or pectoral adenopathy.  Skin:    General: Skin is warm and dry.     Capillary Refill: Capillary refill takes less than 2 seconds.  Neurological:     General: No focal deficit present.     Mental Status: She is alert and oriented to person, place, and time.     Cranial Nerves: No cranial nerve deficit.     Sensory: No sensory deficit.     Motor: No weakness.  Psychiatric:        Mood and Affect: Mood normal.        Behavior: Behavior normal.        Thought Content: Thought content normal.        Judgment: Judgment normal.         Assessment And Plan:     1. Encounter for annual health examination  2. Class 3 severe obesity due to excess calories with body mass index (BMI) of 45.0 to 49.9 in adult, unspecified whether serious comorbidity present [facility]) She is encouraged to strive for BMI less than 30 to decrease cardiac risk. Advised to aim for at least 150 minutes of exercise per week. Encouraged to park further when at the store, take stairs instead of elevators and to walk in place during commercials. Increase water intake to at least one gallon of water daily.  3. COVID-19 vaccination declined  4. Encounter for screening for lipid disorder - Lipid  panel  5. Other long term (current) drug therapy - CBC with Differential/Platelet  6. Type 2 diabetes mellitus with obesity (HCC) Comments: Continue Mounjaro, Hgba1c is improving she will let us know which dose is available at pharmacy due to shortage. EKG done with HR 77 - EKG 12-Lead - Microalbumin / creatinine urine ratio - POCT URINALYSIS DIP (CLINITEK) - Hemoglobin A1c - CMP14+EGFR - tirzepatide (MOUNJARO) 12.5 MG/0.5ML Pen; Inject 12.5 mg into the skin once a week.  Dispense: 2 mL; Refill: 2  7. Tick bite, unspecified site, initial encounter Comments: No significant erythema or symptoms, will check lyme titer due to unsure of how long tick was present on skin - Lyme Disease Serology w/Reflex  8. Anxiety Comments: Continue Xanax as needed. Recent increased stressors with her daughter. - ALPRAZolam (XANAX) 0.5 MG tablet; Take 1 tablet (0.5 mg total) by mouth 3 (three) times daily as needed for sleep or anxiety.  Dispense: 30 tablet; Refill: 0  9. Other insomnia Comments: Currently under increased stress.    Return for 1 year physical, controlled DM check 4 months. Patient was given opportunity to ask questions. Patient verbalized understanding of the plan and was able to repeat key elements of the plan. All questions were answered to their satisfaction.   Arnette Felts, FNP   I, Arnette Felts, FNP, have reviewed all documentation for this visit. The documentation on 01/15/23 for the exam, diagnosis, procedures, and orders are all accurate and complete.   THE PATIENT IS ENCOURAGED TO PRACTICE SOCIAL DISTANCING DUE TO THE COVID-19 PANDEMIC.

## 2023-01-15 NOTE — Patient Instructions (Signed)

## 2023-01-16 ENCOUNTER — Other Ambulatory Visit: Payer: Self-pay

## 2023-01-16 LAB — CBC WITH DIFFERENTIAL/PLATELET
Basophils Absolute: 0 10*3/uL (ref 0.0–0.2)
Basos: 0 %
EOS (ABSOLUTE): 0.2 10*3/uL (ref 0.0–0.4)
Eos: 4 %
Hematocrit: 37.6 % (ref 34.0–46.6)
Hemoglobin: 12.6 g/dL (ref 11.1–15.9)
Immature Grans (Abs): 0 10*3/uL (ref 0.0–0.1)
Immature Granulocytes: 0 %
Lymphocytes Absolute: 1.9 10*3/uL (ref 0.7–3.1)
Lymphs: 37 %
MCH: 31.2 pg (ref 26.6–33.0)
MCHC: 33.5 g/dL (ref 31.5–35.7)
MCV: 93 fL (ref 79–97)
Monocytes Absolute: 0.5 10*3/uL (ref 0.1–0.9)
Monocytes: 9 %
Neutrophils Absolute: 2.6 10*3/uL (ref 1.4–7.0)
Neutrophils: 50 %
Platelets: 265 10*3/uL (ref 150–450)
RBC: 4.04 x10E6/uL (ref 3.77–5.28)
RDW: 13.5 % (ref 11.7–15.4)
WBC: 5.2 10*3/uL (ref 3.4–10.8)

## 2023-01-16 LAB — LIPID PANEL
Chol/HDL Ratio: 3 ratio (ref 0.0–4.4)
Cholesterol, Total: 227 mg/dL — ABNORMAL HIGH (ref 100–199)
HDL: 75 mg/dL (ref >39)
LDL Chol Calc (NIH): 140 mg/dL — ABNORMAL HIGH (ref 0–99)
Triglycerides: 67 mg/dL (ref 0–149)
VLDL Cholesterol Cal: 12 mg/dL (ref 5–40)

## 2023-01-16 LAB — HEMOGLOBIN A1C
Est. average glucose Bld gHb Est-mCnc: 108 mg/dL
Hgb A1c MFr Bld: 5.4 % (ref 4.8–5.6)

## 2023-01-16 LAB — CMP14+EGFR
ALT: 18 IU/L (ref 0–32)
AST: 23 IU/L (ref 0–40)
Albumin/Globulin Ratio: 2 (ref 1.2–2.2)
Albumin: 4.5 g/dL (ref 3.9–4.9)
Alkaline Phosphatase: 43 IU/L — ABNORMAL LOW (ref 44–121)
BUN/Creatinine Ratio: 18 (ref 9–23)
BUN: 16 mg/dL (ref 6–24)
Bilirubin Total: 0.3 mg/dL (ref 0.0–1.2)
CO2: 22 mmol/L (ref 20–29)
Calcium: 9.8 mg/dL (ref 8.7–10.2)
Chloride: 101 mmol/L (ref 96–106)
Creatinine, Ser: 0.87 mg/dL (ref 0.57–1.00)
Globulin, Total: 2.2 g/dL (ref 1.5–4.5)
Glucose: 88 mg/dL (ref 70–99)
Potassium: 4.7 mmol/L (ref 3.5–5.2)
Sodium: 136 mmol/L (ref 134–144)
Total Protein: 6.7 g/dL (ref 6.0–8.5)
eGFR: 85 mL/min/{1.73_m2} (ref >59)

## 2023-01-16 LAB — POCT URINALYSIS DIP (CLINITEK)
Bilirubin, UA: NEGATIVE
Glucose, UA: NEGATIVE mg/dL
Ketones, POC UA: NEGATIVE mg/dL
Leukocytes, UA: NEGATIVE
Nitrite, UA: NEGATIVE
POC PROTEIN,UA: NEGATIVE
Spec Grav, UA: 1.01 (ref 1.010–1.025)
Urobilinogen, UA: 0.2 E.U./dL
pH, UA: 6 (ref 5.0–8.0)

## 2023-01-16 LAB — MICROALBUMIN / CREATININE URINE RATIO
Creatinine, Urine: 32.3 mg/dL
Microalb/Creat Ratio: 15 mg/g creat (ref 0–29)
Microalbumin, Urine: 5 ug/mL

## 2023-01-16 LAB — LYME DISEASE SEROLOGY W/REFLEX: Lyme Total Antibody EIA: NEGATIVE

## 2023-01-17 ENCOUNTER — Other Ambulatory Visit (HOSPITAL_COMMUNITY): Payer: Self-pay

## 2023-01-20 ENCOUNTER — Other Ambulatory Visit (HOSPITAL_COMMUNITY): Payer: Self-pay

## 2023-02-14 ENCOUNTER — Other Ambulatory Visit: Payer: Self-pay | Admitting: Nurse Practitioner

## 2023-02-14 ENCOUNTER — Other Ambulatory Visit: Payer: Self-pay

## 2023-02-14 ENCOUNTER — Other Ambulatory Visit (HOSPITAL_COMMUNITY): Payer: Self-pay

## 2023-02-14 DIAGNOSIS — R062 Wheezing: Secondary | ICD-10-CM

## 2023-02-14 DIAGNOSIS — F419 Anxiety disorder, unspecified: Secondary | ICD-10-CM

## 2023-02-19 ENCOUNTER — Other Ambulatory Visit (HOSPITAL_COMMUNITY): Payer: Self-pay

## 2023-02-21 ENCOUNTER — Other Ambulatory Visit (HOSPITAL_COMMUNITY): Payer: Self-pay

## 2023-02-24 ENCOUNTER — Other Ambulatory Visit: Payer: Self-pay | Admitting: Nurse Practitioner

## 2023-02-24 ENCOUNTER — Other Ambulatory Visit (HOSPITAL_COMMUNITY): Payer: Self-pay

## 2023-02-24 DIAGNOSIS — R062 Wheezing: Secondary | ICD-10-CM

## 2023-02-24 MED ORDER — ALBUTEROL SULFATE HFA 108 (90 BASE) MCG/ACT IN AERS
2.0000 | INHALATION_SPRAY | Freq: Four times a day (QID) | RESPIRATORY_TRACT | 2 refills | Status: DC | PRN
Start: 2023-02-24 — End: 2023-09-21
  Filled 2023-02-24: qty 6.7, 25d supply, fill #0

## 2023-02-26 ENCOUNTER — Other Ambulatory Visit (HOSPITAL_COMMUNITY): Payer: Self-pay

## 2023-02-27 ENCOUNTER — Other Ambulatory Visit (HOSPITAL_COMMUNITY): Payer: Self-pay

## 2023-02-28 ENCOUNTER — Other Ambulatory Visit (HOSPITAL_COMMUNITY): Payer: Self-pay

## 2023-03-03 ENCOUNTER — Other Ambulatory Visit (HOSPITAL_COMMUNITY): Payer: Self-pay

## 2023-03-05 ENCOUNTER — Other Ambulatory Visit: Payer: Self-pay | Admitting: Nurse Practitioner

## 2023-03-05 ENCOUNTER — Other Ambulatory Visit (HOSPITAL_COMMUNITY): Payer: Self-pay

## 2023-03-05 DIAGNOSIS — F419 Anxiety disorder, unspecified: Secondary | ICD-10-CM

## 2023-03-06 ENCOUNTER — Other Ambulatory Visit (HOSPITAL_COMMUNITY): Payer: Self-pay

## 2023-03-07 ENCOUNTER — Other Ambulatory Visit (HOSPITAL_COMMUNITY): Payer: Self-pay

## 2023-03-10 ENCOUNTER — Other Ambulatory Visit (HOSPITAL_COMMUNITY): Payer: Self-pay

## 2023-03-11 ENCOUNTER — Other Ambulatory Visit (HOSPITAL_COMMUNITY): Payer: Self-pay

## 2023-03-12 ENCOUNTER — Other Ambulatory Visit (HOSPITAL_COMMUNITY): Payer: Self-pay

## 2023-03-12 MED ORDER — ALPRAZOLAM 0.5 MG PO TABS
0.5000 mg | ORAL_TABLET | Freq: Three times a day (TID) | ORAL | 0 refills | Status: DC | PRN
Start: 2023-03-12 — End: 2023-09-21
  Filled 2023-03-12 – 2023-03-23 (×2): qty 30, 10d supply, fill #0

## 2023-03-13 ENCOUNTER — Other Ambulatory Visit: Payer: Self-pay

## 2023-03-20 ENCOUNTER — Other Ambulatory Visit (HOSPITAL_COMMUNITY): Payer: Self-pay

## 2023-03-24 ENCOUNTER — Other Ambulatory Visit: Payer: Self-pay

## 2023-03-24 ENCOUNTER — Other Ambulatory Visit (HOSPITAL_COMMUNITY): Payer: Self-pay

## 2023-03-26 ENCOUNTER — Ambulatory Visit: Payer: 59 | Admitting: Urology

## 2023-04-03 ENCOUNTER — Ambulatory Visit: Payer: 59 | Admitting: Urology

## 2023-04-03 NOTE — Progress Notes (Deleted)
   Assessment: 1. Overactive bladder     Plan: ***  Chief Complaint: No chief complaint on file.   HPI: Dorothy Sullivan is a 42 y.o. female who presents for continued evaluation of OAB. Please see my note 12/18/2018 for at the time of initial visit for detailed history. At that time the patient was also noted to have trichomonas vaginalis.  She was treated with Flagyl. In addition, she was started on Vesicare 10 mg daily.   Portions of the above documentation were copied from a prior visit for review purposes only.  Allergies: No Known Allergies  PMH: Past Medical History:  Diagnosis Date   Benign essential hypertension    Dysuria    Elevated blood pressure reading 01/08/2022   Gastritis    Insomnia    Lower extremity edema 01/08/2022   Malaise and fatigue    Malignant essential hypertension    Morbid obesity (HCC)    Panic disorder    Vitamin D deficiency     PSH: Past Surgical History:  Procedure Laterality Date   CESAREAN SECTION     TONSILLECTOMY     TUBAL LIGATION      SH: Social History   Tobacco Use   Smoking status: Never   Smokeless tobacco: Never  Vaping Use   Vaping status: Never Used  Substance Use Topics   Alcohol use: Yes    Alcohol/week: 0.0 standard drinks of alcohol    Comment: occasional   Drug use: No    ROS: Constitutional:  Negative for fever, chills, weight loss CV: Negative for chest pain, previous MI, hypertension Respiratory:  Negative for shortness of breath, wheezing, sleep apnea, frequent cough GI:  Negative for nausea, vomiting, bloody stool, GERD  PE: There were no vitals taken for this visit. GENERAL APPEARANCE:  Well appearing, well developed, well nourished, NAD HEENT:  Atraumatic, normocephalic, oropharynx clear NECK:  Supple without lymphadenopathy or thyromegaly ABDOMEN:  Soft, non-tender, no masses EXTREMITIES:  Moves all extremities well, without clubbing, cyanosis, or edema NEUROLOGIC:  Alert and  oriented x 3, normal gait, CN II-XII grossly intact MENTAL STATUS:  appropriate BACK:  Non-tender to palpation, No CVAT SKIN:  Warm, dry, and intact   Results: No results found for this or any previous visit (from the past 24 hour(s)).

## 2023-04-16 ENCOUNTER — Other Ambulatory Visit: Payer: Self-pay | Admitting: Nurse Practitioner

## 2023-04-16 ENCOUNTER — Other Ambulatory Visit (HOSPITAL_COMMUNITY): Payer: Self-pay

## 2023-04-16 ENCOUNTER — Other Ambulatory Visit (HOSPITAL_BASED_OUTPATIENT_CLINIC_OR_DEPARTMENT_OTHER): Payer: Self-pay

## 2023-04-16 DIAGNOSIS — E669 Obesity, unspecified: Secondary | ICD-10-CM

## 2023-04-16 MED ORDER — MOUNJARO 12.5 MG/0.5ML ~~LOC~~ SOAJ
12.5000 mg | SUBCUTANEOUS | 2 refills | Status: DC
  Filled 2023-04-16: qty 2, 28d supply, fill #0

## 2023-04-22 ENCOUNTER — Other Ambulatory Visit (HOSPITAL_COMMUNITY): Payer: Self-pay

## 2023-05-19 ENCOUNTER — Other Ambulatory Visit (HOSPITAL_COMMUNITY): Payer: Self-pay

## 2023-05-19 ENCOUNTER — Ambulatory Visit: Payer: 59 | Admitting: Nurse Practitioner

## 2023-05-19 ENCOUNTER — Encounter: Payer: Self-pay | Admitting: Nurse Practitioner

## 2023-05-19 VITALS — BP 120/70 | HR 68 | Temp 97.7°F | Ht 65.0 in | Wt 297.0 lb

## 2023-05-19 DIAGNOSIS — Z6841 Body Mass Index (BMI) 40.0 and over, adult: Secondary | ICD-10-CM

## 2023-05-19 DIAGNOSIS — E1169 Type 2 diabetes mellitus with other specified complication: Secondary | ICD-10-CM

## 2023-05-19 DIAGNOSIS — Z2821 Immunization not carried out because of patient refusal: Secondary | ICD-10-CM

## 2023-05-19 DIAGNOSIS — E669 Obesity, unspecified: Secondary | ICD-10-CM | POA: Diagnosis not present

## 2023-05-19 MED ORDER — MOUNJARO 12.5 MG/0.5ML ~~LOC~~ SOAJ
12.5000 mg | SUBCUTANEOUS | 1 refills | Status: DC
  Filled 2023-05-19 (×2): qty 6, 84d supply, fill #0
  Filled 2023-08-26: qty 6, 84d supply, fill #1

## 2023-05-19 NOTE — Progress Notes (Signed)
Madelaine Bhat, CMA,acting as a Neurosurgeon for Arnette Felts, FNP.,have documented all relevant documentation on the behalf of Arnette Felts, FNP,as directed by  Arnette Felts, FNP while in the presence of Arnette Felts, FNP.  Subjective:  [name] , female    DOB: March 12, 1981 , 42 y.o.   MRN: [id number]  Chief Complaint  Patient presents with   Diabetes    HPI  Patient presents today for a DM follow up, Patient reports compliance with medication. Patient denies any chest pain, SOB, or headaches. Patient has no concerns today.  BP Readings from Last 3 Encounters: 05/19/23 : 120/70 01/15/23 : 132/70 12/18/22 : 137/84   Wt Readings from Last 3 Encounters: 05/19/23 : 297 lb (134.7 kg) 01/15/23 : 293 lb (132.9 kg) 12/18/22 : 298 lb (135.2 kg)  She has been off her Keto diet for 6 weeks. She has been back on for 2 weeks. She does admit to gaining a significant amount of weight during that time. She plans to start exercising in October.      Past Medical History:  Diagnosis Date   Benign essential hypertension    BV (bacterial vaginosis) 12/14/2012   Flagyl Rx.     Dysuria    Elevated blood pressure reading 01/08/2022   Gastritis    Insomnia    Lower extremity edema 01/08/2022   Malaise and fatigue    Malignant essential hypertension    Morbid obesity (HCC)    Panic disorder    Vitamin D deficiency      Family History  Problem Relation Age of Onset   Kidney disease Father    Aneurysm Father    Migraines Sister    Kidney disease Sister    Kidney disease Paternal Grandmother    Heart disease Paternal Grandfather      Current Outpatient Medications:    albuterol (VENTOLIN HFA) 108 (90 Base) MCG/ACT inhaler, Inhale 2 puffs into the lungs every 6 (six) hours as needed for wheezing or shortness of breath., Disp: 6.7 g, Rfl: 2   ALPRAZolam (XANAX) 0.5 MG tablet, Take 1 tablet (0.5 mg total) by mouth 3 (three) times daily as needed for sleep or anxiety., Disp:  30 tablet, Rfl: 0   blood glucose meter kit and supplies KIT, Use up to four times daily as directed., Disp: 1 each, Rfl: 0   glucose blood test strip, Use up to four times daily as directed., Disp: 100 each, Rfl: 0   Lancets (FREESTYLE) lancets, Use up to four times daily as directed., Disp: 100 each, Rfl: 0   Probiotic Product (RESTORA) CAPS, Take 1 capsule by mouth daily., Disp: 30 capsule, Rfl: 5   solifenacin (VESICARE) 10 MG tablet, Take 1 tablet (10 mg total) by mouth daily., Disp: 30 tablet, Rfl: 2   furosemide (LASIX) 20 MG tablet, Take 1 tablet (20 mg total) by mouth daily. (Patient not taking: Reported on 05/19/2023), Disp: 90 tablet, Rfl: 1   tirzepatide (MOUNJARO) 12.5 MG/0.5ML Pen, Inject 12.5 mg into the skin once a week., Disp: 6 mL, Rfl: 1   No Known Allergies   Review of Systems  Constitutional: Negative.   HENT: Negative.    Eyes: Negative.   Respiratory: Negative.    Cardiovascular: Negative.   Gastrointestinal: Negative.   Musculoskeletal: Negative.   Neurological: Negative.   Psychiatric/Behavioral: Negative.       Today's Vitals   05/19/23 1439  BP: 120/70  Pulse: 68  Temp: 97.7 F (36.5 C)  TempSrc: Oral  Weight: 297 lb (134.7 kg)  Height: 5\' 5"  (1.651 m)  PainSc: 0-No pain   Body mass index is 49.42 kg/m.  Wt Readings from Last 3 Encounters:  05/19/23 297 lb (134.7 kg)  01/15/23 293 lb (132.9 kg)  12/18/22 298 lb (135.2 kg)     Objective:  Physical Exam Vitals reviewed.  Constitutional:      General: She is not in acute distress.    Appearance: Normal appearance. She is obese.  Cardiovascular:     Rate and Rhythm: Normal rate and regular rhythm.     Pulses: Normal pulses.     Heart sounds: Normal heart sounds. No murmur heard. Pulmonary:     Effort: Pulmonary effort is normal. No respiratory distress.     Breath sounds: Normal breath sounds. No wheezing.  Musculoskeletal:        General: No swelling or tenderness.     Right lower leg:  No edema.     Left lower leg: No edema.  Skin:    General: Skin is warm and dry.     Capillary Refill: Capillary refill takes less than 2 seconds.  Neurological:     General: No focal deficit present.     Mental Status: She is alert and oriented to person, place, and time.     Cranial Nerves: No cranial nerve deficit.     Motor: No weakness.  Psychiatric:        Mood and Affect: Mood normal.        Behavior: Behavior normal.        Thought Content: Thought content normal.        Judgment: Judgment normal.     Title   Diabetic Foot Exam - detailed Date & Time: 05/19/2023  3:05 PM Diabetic Foot exam was performed with the following findings: Yes  Visual Foot Exam completed.: Yes  Is there a history of foot ulcer?: No Is there a foot ulcer now?: No Is there swelling?: No Is there elevated skin temperature?: No Is there abnormal foot shape?: No Is there a claw toe deformity?: No Are the toenails long?: No Are the toenails thick?: No Are the toenails ingrown?: No Is the skin thin, fragile, shiny and hairless?": No Normal Range of Motion?: Yes Is there foot or ankle muscle weakness?: No Do you have pain in calf while walking?: No Are the shoes appropriate in style and fit?: Yes Can the patient see the bottom of their feet?: Yes Pulse Foot Exam completed.: Yes   Right Posterior Tibialis: Present Left posterior Tibialis: Present   Right Dorsalis Pedis: Present Left Dorsalis Pedis: Present     Sensory Foot Exam Completed.: Yes Semmes-Weinstein Monofilament Test "+" means "has sensation" and "-" means "no sensation"   R Site 1-Great Toe: Pos L Site 1-Great Toe: Pos   R Site 4: Pos L Site 4: Pos   R Site 6: Pos L Site 6: Pos     Image components are not supported.   Image components are not supported. Image components are not supported.  [place] Comments          Assessment And Plan:  Type 2 diabetes mellitus with obesity (HCC) Assessment & Plan: Continue  current medications, HgbA1c is improving. Diabetic foot exam done  Orders: -     Hemoglobin A1c -     BMP8+eGFR -     Mounjaro; Inject 12.5 mg into the skin once a week.  Dispense: 6 mL; Refill: 1  Influenza vaccination declined Assessment & Plan: Patient declined influenza vaccination at this time. Patient is aware that influenza vaccine prevents illness in 70% of healthy people, and reduces hospitalizations to 30-70% in elderly. This vaccine is recommended annually. Education has been provided regarding the importance of this vaccine but patient still declined. Advised may receive this vaccine at local pharmacy or Health Dept.or vaccine clinic. Aware to provide a copy of the vaccination record if obtained from local pharmacy or Health Dept.  Pt is willing to accept risk associated with refusing vaccination.    COVID-19 vaccination declined Assessment & Plan: Declines covid 19 vaccine. Discussed risk of covid 20 and if she changes her mind about the vaccine to call the office. Education has been provided regarding the importance of this vaccine but patient still declined. Advised may receive this vaccine at local pharmacy or Health Dept.or vaccine clinic. Aware to provide a copy of the vaccination record if obtained from local pharmacy or Health Dept.  Encouraged to take multivitamin, vitamin d, vitamin c and zinc to increase immune system. Aware can call office if would like to have vaccine here at office. Verbalized acceptance and understanding.    Class 3 severe obesity due to excess calories with body mass index (BMI) of 45.0 to 49.9 in adult, unspecified whether serious comorbidity present [facility]) Assessment & Plan: She is encouraged to strive for BMI less than 30 to decrease cardiac risk. Advised to aim for at least 150 minutes of exercise per week.      Return for controlled DM check 4 months.  Patient was given opportunity to ask questions. Patient verbalized understanding of the  plan and was able to repeat key elements of the plan. All questions were answered to their satisfaction.    Jeanell Sparrow, FNP, have reviewed all documentation for this visit. The documentation on 05/19/23 for the exam, diagnosis, procedures, and orders are all accurate and complete.   IF YOU HAVE BEEN REFERRED TO A SPECIALIST, IT MAY TAKE 1-2 WEEKS TO SCHEDULE/PROCESS THE REFERRAL. IF YOU HAVE NOT HEARD FROM US/SPECIALIST IN TWO WEEKS, PLEASE GIVE [place] A CALL AT [phone].

## 2023-05-20 ENCOUNTER — Other Ambulatory Visit (HOSPITAL_COMMUNITY): Payer: Self-pay

## 2023-05-20 LAB — HEMOGLOBIN A1C
Est. average glucose Bld gHb Est-mCnc: 114 mg/dL
Hgb A1c MFr Bld: 5.6 % (ref 4.8–5.6)

## 2023-05-20 LAB — BMP8+EGFR
BUN/Creatinine Ratio: 17 (ref 9–23)
BUN: 16 mg/dL (ref 6–24)
CO2: 23 mmol/L (ref 20–29)
Calcium: 9.1 mg/dL (ref 8.7–10.2)
Chloride: 102 mmol/L (ref 96–106)
Creatinine, Ser: 0.94 mg/dL (ref 0.57–1.00)
Glucose: 91 mg/dL (ref 70–99)
Potassium: 4.8 mmol/L (ref 3.5–5.2)
Sodium: 137 mmol/L (ref 134–144)
eGFR: 77 mL/min/{1.73_m2} (ref >59)

## 2023-05-21 ENCOUNTER — Other Ambulatory Visit (HOSPITAL_COMMUNITY): Payer: Self-pay

## 2023-05-22 ENCOUNTER — Encounter: Payer: Self-pay | Admitting: Nurse Practitioner

## 2023-05-22 ENCOUNTER — Other Ambulatory Visit (HOSPITAL_COMMUNITY): Payer: Self-pay

## 2023-06-03 ENCOUNTER — Encounter: Payer: Self-pay | Admitting: Nurse Practitioner

## 2023-06-03 DIAGNOSIS — Z2821 Immunization not carried out because of patient refusal: Secondary | ICD-10-CM | POA: Insufficient documentation

## 2023-06-03 NOTE — Assessment & Plan Note (Addendum)
Continue current medications, HgbA1c is improving. Diabetic foot exam done

## 2023-06-03 NOTE — Assessment & Plan Note (Signed)
Declines covid 19 vaccine. Discussed risk of covid 45 and if she changes her mind about the vaccine to call the office. Education has been provided regarding the importance of this vaccine but patient still declined. Advised may receive this vaccine at local pharmacy or Health Dept.or vaccine clinic. Aware to provide a copy of the vaccination record if obtained from local pharmacy or Health Dept.  Encouraged to take multivitamin, vitamin d, vitamin c and zinc to increase immune system. Aware can call office if would like to have vaccine here at office. Verbalized acceptance and understanding.

## 2023-06-03 NOTE — Assessment & Plan Note (Signed)
She is encouraged to strive for BMI less than 30 to decrease cardiac risk. Advised to aim for at least 150 minutes of exercise per week.  

## 2023-06-03 NOTE — Assessment & Plan Note (Signed)

## 2023-06-18 ENCOUNTER — Other Ambulatory Visit (HOSPITAL_COMMUNITY): Payer: Self-pay

## 2023-06-18 MED ORDER — INFLUENZA VIRUS VACC SPLIT PF (FLUZONE) 0.5 ML IM SUSY
0.5000 mL | PREFILLED_SYRINGE | Freq: Once | INTRAMUSCULAR | 0 refills | Status: AC
  Filled 2023-06-18: qty 0.5, 1d supply, fill #0

## 2023-08-26 ENCOUNTER — Other Ambulatory Visit (HOSPITAL_COMMUNITY): Payer: Self-pay

## 2023-08-28 ENCOUNTER — Other Ambulatory Visit (HOSPITAL_COMMUNITY): Payer: Self-pay

## 2023-09-18 ENCOUNTER — Ambulatory Visit: Payer: 59 | Admitting: Nurse Practitioner

## 2023-09-18 ENCOUNTER — Other Ambulatory Visit (HOSPITAL_BASED_OUTPATIENT_CLINIC_OR_DEPARTMENT_OTHER): Payer: Self-pay

## 2023-09-18 ENCOUNTER — Other Ambulatory Visit (HOSPITAL_COMMUNITY): Payer: Self-pay

## 2023-09-18 ENCOUNTER — Encounter: Payer: Self-pay | Admitting: Nurse Practitioner

## 2023-09-18 VITALS — BP 122/80 | HR 79 | Temp 98.6°F | Ht 65.0 in | Wt 307.0 lb

## 2023-09-18 DIAGNOSIS — Z2821 Immunization not carried out because of patient refusal: Secondary | ICD-10-CM | POA: Diagnosis not present

## 2023-09-18 DIAGNOSIS — Z6841 Body Mass Index (BMI) 40.0 and over, adult: Secondary | ICD-10-CM

## 2023-09-18 DIAGNOSIS — E669 Obesity, unspecified: Secondary | ICD-10-CM | POA: Diagnosis not present

## 2023-09-18 DIAGNOSIS — M79604 Pain in right leg: Secondary | ICD-10-CM | POA: Diagnosis not present

## 2023-09-18 DIAGNOSIS — E1169 Type 2 diabetes mellitus with other specified complication: Secondary | ICD-10-CM

## 2023-09-18 MED ORDER — MOUNJARO 15 MG/0.5ML ~~LOC~~ SOAJ
15.0000 mg | SUBCUTANEOUS | 1 refills | Status: DC
  Filled 2023-09-18 – 2023-10-09 (×3): qty 6, 84d supply, fill #0
  Filled 2023-10-10: qty 2, 28d supply, fill #0
  Filled 2023-11-10: qty 2, 28d supply, fill #1
  Filled 2023-11-13: qty 6, 84d supply, fill #1

## 2023-09-18 MED ORDER — PREDNISONE 20 MG PO TABS
ORAL_TABLET | ORAL | 0 refills | Status: DC
Start: 2023-09-18 — End: 2024-01-20
  Filled 2023-09-18: qty 6, 3d supply, fill #0

## 2023-09-18 NOTE — Progress Notes (Signed)
Madelaine Bhat, CMA,acting as a Neurosurgeon for Arnette Felts, FNP.,have documented all relevant documentation on the behalf of Arnette Felts, FNP,as directed by  Arnette Felts, FNP while in the presence of Arnette Felts, FNP.  Subjective:  [name] , female    DOB: 03-Jul-1981 , 43 y.o.   MRN: [id number]  Chief Complaint  Patient presents with   Diabetes    HPI  Patient presents today for a dm follow up, Patient reports compliance with medication. Patient denies any chest pain, SOB, or headaches. Patient has pain in her right leg for the past month. She reports it is tender to touch. The pain has worsened these past 2 weeks. She continues to take Mounjaro 12.5 mg, has not been out of the medications. She has noticed she is more hungry. She has been exercising the last 3 weeks. Gym (cardio mostly) - 45 - 60 minutes She has met with a trainer 2 times the last couple of weeks. To touch with her pajama pants. Nothing stops the pain but is worse at night.   Diabetes She presents for her follow-up diabetic visit. She has type 2 diabetes mellitus. Her disease course has been improving. There are no hypoglycemic associated symptoms. There are no diabetic associated symptoms. There are no hypoglycemic complications. Symptoms are stable. There are no diabetic complications. Risk factors for coronary artery disease include obesity. Current diabetic treatment includes oral agent (monotherapy). She is following a generally unhealthy diet. When asked about meal planning, she reported none. She has not had a previous visit with a dietitian. She participates in exercise every other day. (Has been checking blood sugar sporadically does not remember readings - left her meter at her mothers house. ) She does not see a podiatrist.Eye exam is not current.  Hypertension This is a chronic problem. The problem is controlled. Pertinent negatives include no anxiety. Risk factors for coronary artery disease  include obesity.     Past Medical History:  Diagnosis Date   Benign essential hypertension    BV (bacterial vaginosis) 12/14/2012   Flagyl Rx.     Dysuria    Elevated blood pressure reading 01/08/2022   Gastritis    Insomnia    Lower extremity edema 01/08/2022   Malaise and fatigue    Malignant essential hypertension    Morbid obesity (HCC)    Panic disorder    Vitamin D deficiency      Family History  Problem Relation Age of Onset   Kidney disease Father    Aneurysm Father    Migraines Sister    Kidney disease Sister    Kidney disease Paternal Grandmother    Heart disease Paternal Grandfather      Current Outpatient Medications:    blood glucose meter kit and supplies KIT, Use up to four times daily as directed., Disp: 1 each, Rfl: 0   glucose blood test strip, Use up to four times daily as directed., Disp: 100 each, Rfl: 0   Lancets (FREESTYLE) lancets, Use up to four times daily as directed., Disp: 100 each, Rfl: 0   predniSONE (DELTASONE) 20 MG tablet, Take 2 tablets by mouth daily x 3 days, Disp: 6 tablet, Rfl: 0   tirzepatide (MOUNJARO) 15 MG/0.5ML Pen, Inject 15 mg into the skin once a week., Disp: 6 mL, Rfl: 1   solifenacin (VESICARE) 10 MG tablet, Take 1 tablet (10 mg total) by mouth daily. (Patient not taking: Reported on 09/18/2023), Disp: 30 tablet, Rfl: 2  No Known Allergies   Review of Systems  Constitutional: Negative.   HENT: Negative.    Eyes: Negative.   Respiratory: Negative.    Cardiovascular: Negative.   Gastrointestinal: Negative.   Musculoskeletal: Negative.        Posterior leg from thigh to 5th metatarsal.   Neurological: Negative.   Psychiatric/Behavioral: Negative.       Today's Vitals   09/18/23 1411  BP: 122/80  Pulse: 79  Temp: 98.6 F (37 C)  TempSrc: Oral  Weight: (!) 307 lb (139.3 kg)  Height: 5\' 5"  (1.651 m)  PainSc: 0-No pain   Body mass index is 51.09 kg/m.  Wt Readings from Last 3 Encounters:  09/18/23 (!) 307 lb  (139.3 kg)  05/19/23 297 lb (134.7 kg)  01/15/23 293 lb (132.9 kg)     Objective:  Physical Exam Vitals reviewed.  Constitutional:      General: She is not in acute distress.    Appearance: Normal appearance. She is obese.  Cardiovascular:     Rate and Rhythm: Normal rate and regular rhythm.     Pulses: Normal pulses.     Heart sounds: Normal heart sounds. No murmur heard. Pulmonary:     Effort: Pulmonary effort is normal. No respiratory distress.     Breath sounds: Normal breath sounds. No wheezing.  Musculoskeletal:        General: No swelling or tenderness (posterior right calf, sensation worse).     Right lower leg: No edema.     Left lower leg: No edema.  Skin:    General: Skin is warm and dry.     Capillary Refill: Capillary refill takes less than 2 seconds.  Neurological:     General: No focal deficit present.     Mental Status: She is alert and oriented to person, place, and time.     Cranial Nerves: No cranial nerve deficit.     Motor: No weakness.  Psychiatric:        Mood and Affect: Mood normal.        Behavior: Behavior normal.        Thought Content: Thought content normal.        Judgment: Judgment normal.         Assessment And Plan:  Type 2 diabetes mellitus with obesity (HCC) Assessment & Plan: Continue current medications, HgbA1c is improving. Will check HgbA1c.   Orders: -     Hemoglobin A1c -     BMP8+eGFR -     Mounjaro; Inject 15 mg into the skin once a week.  Dispense: 6 mL; Refill: 1  Morbid obesity due to excess calories [facility]) Assessment & Plan: She is encouraged to strive for BMI less than 30 to decrease cardiac risk. Advised to aim for at least 150 minutes of exercise per week.    BMI 50.0-59.9, adult (HCC)  COVID-19 vaccination declined Assessment & Plan: Declines covid 19 vaccine. Discussed risk of covid 24 and if she changes her mind about the vaccine to call the office. Education has been provided regarding the importance of  this vaccine but patient still declined. Advised may receive this vaccine at local pharmacy or Health Dept.or vaccine clinic. Aware to provide a copy of the vaccination record if obtained from local pharmacy or Health Dept.  Encouraged to take multivitamin, vitamin d, vitamin c and zinc to increase immune system. Aware can call office if would like to have vaccine here at office. Verbalized acceptance and understanding.  Pneumococcal vaccination declined  Pain of right lower extremity Assessment & Plan: Slightly decreased sensation, no swelling noted. Due to symptoms on one side will send for venous doppler.   Orders: -     predniSONE; Take 2 tablets by mouth daily x 3 days  Dispense: 6 tablet; Refill: 0 -     VAS [place] LOWER EXTREMITY VENOUS (DVT); Future    Return for controlled DM check-4 months.  Patient was given opportunity to ask questions. Patient verbalized understanding of the plan and was able to repeat key elements of the plan. All questions were answered to their satisfaction.    Jeanell Sparrow, FNP, have reviewed all documentation for this visit. The documentation on 09/18/23 for the exam, diagnosis, procedures, and orders are all accurate and complete.   IF YOU HAVE BEEN REFERRED TO A SPECIALIST, IT MAY TAKE 1-2 WEEKS TO SCHEDULE/PROCESS THE REFERRAL. IF YOU HAVE NOT HEARD FROM US/SPECIALIST IN TWO WEEKS, PLEASE GIVE [place] A CALL AT [phone].

## 2023-09-18 NOTE — Patient Instructions (Addendum)
                    You can take over the counter voltaren gel or salonpas patch if in one area If you have shortness of breath or chest pain go to ER.

## 2023-09-19 LAB — BMP8+EGFR
BUN/Creatinine Ratio: 14 (ref 9–23)
BUN: 17 mg/dL (ref 6–24)
CO2: 22 mmol/L (ref 20–29)
Calcium: 9.6 mg/dL (ref 8.7–10.2)
Chloride: 102 mmol/L (ref 96–106)
Creatinine, Ser: 1.24 mg/dL — ABNORMAL HIGH (ref 0.57–1.00)
Glucose: 85 mg/dL (ref 70–99)
Potassium: 4.6 mmol/L (ref 3.5–5.2)
Sodium: 138 mmol/L (ref 134–144)
eGFR: 55 mL/min/{1.73_m2} — ABNORMAL LOW (ref >59)

## 2023-09-19 LAB — HEMOGLOBIN A1C
Est. average glucose Bld gHb Est-mCnc: 111 mg/dL
Hgb A1c MFr Bld: 5.5 % (ref 4.8–5.6)

## 2023-09-21 ENCOUNTER — Encounter: Payer: Self-pay | Admitting: Nurse Practitioner

## 2023-09-21 DIAGNOSIS — M79604 Pain in right leg: Secondary | ICD-10-CM | POA: Insufficient documentation

## 2023-09-21 DIAGNOSIS — Z6841 Body Mass Index (BMI) 40.0 and over, adult: Secondary | ICD-10-CM | POA: Insufficient documentation

## 2023-09-21 NOTE — Assessment & Plan Note (Signed)
Continue current medications, HgbA1c is improving. Will check HgbA1c.

## 2023-09-21 NOTE — Assessment & Plan Note (Signed)
Slightly decreased sensation, no swelling noted. Due to symptoms on one side will send for venous doppler.

## 2023-09-21 NOTE — Assessment & Plan Note (Signed)
 She is encouraged to strive for BMI less than 30 to decrease cardiac risk. Advised to aim for at least 150 minutes of exercise per week.

## 2023-09-21 NOTE — Assessment & Plan Note (Signed)

## 2023-09-22 ENCOUNTER — Other Ambulatory Visit (HOSPITAL_COMMUNITY): Payer: Self-pay

## 2023-09-23 ENCOUNTER — Other Ambulatory Visit (HOSPITAL_COMMUNITY): Payer: Self-pay

## 2023-09-26 ENCOUNTER — Other Ambulatory Visit: Payer: Self-pay

## 2023-10-09 ENCOUNTER — Other Ambulatory Visit (HOSPITAL_COMMUNITY): Payer: Self-pay

## 2023-10-10 ENCOUNTER — Other Ambulatory Visit (HOSPITAL_COMMUNITY): Payer: Self-pay

## 2023-11-10 ENCOUNTER — Other Ambulatory Visit: Payer: Self-pay | Admitting: Nurse Practitioner

## 2023-11-10 ENCOUNTER — Other Ambulatory Visit (HOSPITAL_COMMUNITY): Payer: Self-pay

## 2023-11-11 ENCOUNTER — Other Ambulatory Visit: Payer: Self-pay

## 2023-11-13 ENCOUNTER — Other Ambulatory Visit (HOSPITAL_COMMUNITY): Payer: Self-pay

## 2023-11-22 ENCOUNTER — Other Ambulatory Visit (HOSPITAL_COMMUNITY): Payer: Self-pay

## 2024-01-19 ENCOUNTER — Encounter: Payer: Commercial Managed Care - PPO | Admitting: Nurse Practitioner

## 2024-01-20 ENCOUNTER — Encounter: Payer: Self-pay | Admitting: Nurse Practitioner

## 2024-01-20 ENCOUNTER — Other Ambulatory Visit (HOSPITAL_COMMUNITY): Payer: Self-pay

## 2024-01-20 ENCOUNTER — Other Ambulatory Visit (HOSPITAL_BASED_OUTPATIENT_CLINIC_OR_DEPARTMENT_OTHER): Payer: Self-pay

## 2024-01-20 ENCOUNTER — Ambulatory Visit (INDEPENDENT_AMBULATORY_CARE_PROVIDER_SITE_OTHER): Payer: Commercial Managed Care - PPO | Admitting: Nurse Practitioner

## 2024-01-20 VITALS — BP 126/80 | HR 84 | Temp 98.1°F | Ht 65.0 in | Wt 300.0 lb

## 2024-01-20 DIAGNOSIS — Z79899 Other long term (current) drug therapy: Secondary | ICD-10-CM | POA: Diagnosis not present

## 2024-01-20 DIAGNOSIS — Z6841 Body Mass Index (BMI) 40.0 and over, adult: Secondary | ICD-10-CM

## 2024-01-20 DIAGNOSIS — E66813 Obesity, class 3: Secondary | ICD-10-CM | POA: Diagnosis not present

## 2024-01-20 DIAGNOSIS — E1169 Type 2 diabetes mellitus with other specified complication: Secondary | ICD-10-CM | POA: Diagnosis not present

## 2024-01-20 DIAGNOSIS — M791 Myalgia, unspecified site: Secondary | ICD-10-CM

## 2024-01-20 DIAGNOSIS — Z2821 Immunization not carried out because of patient refusal: Secondary | ICD-10-CM | POA: Diagnosis not present

## 2024-01-20 DIAGNOSIS — E669 Obesity, unspecified: Secondary | ICD-10-CM | POA: Diagnosis not present

## 2024-01-20 DIAGNOSIS — Z0000 Encounter for general adult medical examination without abnormal findings: Secondary | ICD-10-CM | POA: Diagnosis not present

## 2024-01-20 LAB — POCT URINALYSIS DIP (CLINITEK)
Bilirubin, UA: NEGATIVE
Blood, UA: NEGATIVE
Glucose, UA: NEGATIVE mg/dL
Leukocytes, UA: NEGATIVE
Nitrite, UA: NEGATIVE
POC PROTEIN,UA: NEGATIVE
Spec Grav, UA: 1.02 (ref 1.010–1.025)
Urobilinogen, UA: 0.2 U/dL
pH, UA: 7 (ref 5.0–8.0)

## 2024-01-20 MED ORDER — MOUNJARO 15 MG/0.5ML ~~LOC~~ SOAJ
15.0000 mg | SUBCUTANEOUS | 1 refills | Status: DC
  Filled 2024-01-20 – 2024-01-22 (×3): qty 6, 84d supply, fill #0
  Filled 2024-04-24: qty 6, 84d supply, fill #1

## 2024-01-20 MED ORDER — DICLOFENAC SODIUM 1 % EX GEL
2.0000 g | Freq: Four times a day (QID) | CUTANEOUS | 2 refills | Status: DC
  Filled 2024-01-20: qty 300, 37d supply, fill #0
  Filled 2024-07-26: qty 300, 37d supply, fill #1

## 2024-01-20 MED ORDER — CYCLOBENZAPRINE HCL 10 MG PO TABS
10.0000 mg | ORAL_TABLET | Freq: Three times a day (TID) | ORAL | 0 refills | Status: AC | PRN
  Filled 2024-01-20: qty 30, 10d supply, fill #0

## 2024-01-20 NOTE — Progress Notes (Signed)
 I,Jameka J Llittleton, CMA,acting as a Neurosurgeon for [employer], FNP.,have documented all relevant documentation on the behalf of Dorothy Epley, FNP,as directed by  Dorothy Epley, FNP while in the presence of Dorothy Epley, FNP.  Subjective:    [name] , female    DOB: 1981-03-26 , 43 y.o.   MRN: [id number]  Chief Complaint  Patient presents with   Annual Exam    Patient presents today for HM, patient reports compliance with medications and has no other concerns today.  Patient reports she hasn't had her mammogram nor her eye exam.      HPI  Here for HM. She sees Dr. Pola Brine for her GYN care. She went to [facility] but will find another provider in the network.   She reports her weight was down to 282 lbs in March. She is using a coach for her fitness goals.      Past Medical History:  Diagnosis Date   Benign essential hypertension    BV (bacterial vaginosis) 12/14/2012   Flagyl  Rx.     Dysuria    Elevated blood pressure reading 01/08/2022   Gastritis    Insomnia    Lower extremity edema 01/08/2022   Malaise and fatigue    Malignant essential hypertension    Morbid obesity (HCC)    Panic disorder    Vitamin D deficiency      Family History  Problem Relation Age of Onset   Kidney disease Father    Aneurysm Father    Migraines Sister    Kidney disease Sister    Kidney disease Paternal Grandmother    Heart disease Paternal Grandfather      Current Outpatient Medications:    blood glucose meter kit and supplies KIT, Use up to four times daily as directed., Disp: 1 each, Rfl: 0   cyclobenzaprine  (FLEXERIL ) 10 MG tablet, Take 1 tablet (10 mg total) by mouth 3 (three) times daily as needed., Disp: 30 tablet, Rfl: 0   diclofenac  Sodium (VOLTAREN ) 1 % GEL, Apply 2 g topically 4 (four) times daily., Disp: 150 g, Rfl: 2   glucose blood test strip, Use up to four times daily as directed., Disp: 100 each, Rfl: 0   Lancets (FREESTYLE) lancets, Use up  to four times daily as directed., Disp: 100 each, Rfl: 0   solifenacin  (VESICARE ) 10 MG tablet, Take 1 tablet (10 mg total) by mouth daily. (Patient not taking: Reported on 09/18/2023), Disp: 30 tablet, Rfl: 2   tirzepatide  (MOUNJARO ) 15 MG/0.5ML Pen, Inject 15 mg into the skin once a week., Disp: 6 mL, Rfl: 1   No Known Allergies    The patient states she uses tubal ligation for birth control. Patient's last menstrual period was 01/07/2024.   Negative for Dysmenorrhea and Negative for Menorrhagia. Negative for: breast discharge, breast lump(s), breast pain and breast self exam. Associated symptoms include abnormal vaginal bleeding. Pertinent negatives include abnormal bleeding (hematology), anxiety, decreased libido, depression, difficulty falling sleep, dyspareunia, history of infertility, nocturia, sexual dysfunction, sleep disturbances, urinary incontinence, urinary urgency, vaginal discharge and vaginal itching. Diet regular. She does mostly keto diet. She has cut down on her brown sodas and will drink ginger ale and fresca. She is drinking about 80-100 oz water a day. The patient states her exercise level is minimal - the last month has been no exercise due to graduations, milestone parties and being on the road. She does have a workout room at home - treadmill,  vibration, exercise ball and youtube videos.    The patient's tobacco use is:  Social History   Tobacco Use  Smoking Status Never  Smokeless Tobacco Never   She has been exposed to passive smoke. The patient's alcohol use is:  Social History   Substance and Sexual Activity  Alcohol Use Yes   Alcohol/week: 0.0 standard drinks of alcohol   Comment: occasional   Additional information: Last pap 12/10/2022, next one scheduled for 12/09/2025.    Review of Systems  Constitutional: Negative.   HENT: Negative.    Eyes: Negative.   Respiratory: Negative.    Cardiovascular: Negative.   Gastrointestinal: Negative.   Endocrine:  Negative.   Genitourinary: Negative.   Musculoskeletal: Negative.   Skin: Negative.   Allergic/Immunologic: Negative.   Neurological: Negative.   Hematological: Negative.   Psychiatric/Behavioral: Negative.       Today's Vitals   01/20/24 1406  BP: 126/80  Pulse: 84  Temp: 98.1 F (36.7 C)  TempSrc: Oral  Weight: 300 lb (136.1 kg)  Height: 5\' 5"  (1.651 m)  PainSc: 0-No pain   Body mass index is 49.92 kg/m.  Wt Readings from Last 3 Encounters:  01/20/24 300 lb (136.1 kg)  09/18/23 (!) 307 lb (139.3 kg)  05/19/23 297 lb (134.7 kg)     Objective:  Physical Exam Vitals and nursing note reviewed.  Constitutional:      General: She is not in acute distress.    Appearance: Normal appearance. She is well-developed. She is obese.  HENT:     Head: Normocephalic and atraumatic.     Right Ear: Hearing, tympanic membrane, ear canal and external ear normal. There is no impacted cerumen.     Left Ear: Hearing, tympanic membrane, ear canal and external ear normal. There is no impacted cerumen.     Nose: Nose normal.     Mouth/Throat:     Mouth: Mucous membranes are moist.  Eyes:     General: Lids are normal.     Extraocular Movements: Extraocular movements intact.     Conjunctiva/sclera: Conjunctivae normal.     Pupils: Pupils are equal, round, and reactive to light.     Funduscopic exam:    Right eye: No papilledema.        Left eye: No papilledema.  Neck:     Thyroid : No thyroid  mass.     Vascular: No carotid bruit.  Cardiovascular:     Rate and Rhythm: Normal rate and regular rhythm.     Pulses: Normal pulses.     Heart sounds: Normal heart sounds. No murmur heard. Pulmonary:     Effort: Pulmonary effort is normal. No respiratory distress.     Breath sounds: Normal breath sounds. No wheezing.  Chest:     Chest wall: No mass.  Breasts:    Tanner Score is 5.     Right: Normal. No mass or tenderness.     Left: Normal. No mass or tenderness.  Abdominal:     General:  Abdomen is flat. Bowel sounds are normal. There is no distension.     Palpations: Abdomen is soft.     Tenderness: There is no abdominal tenderness.  Genitourinary:    Comments: NA - followed by GYN Musculoskeletal:        General: No swelling or tenderness. Normal range of motion.     Cervical back: Full passive range of motion without pain, normal range of motion and neck supple.     Right lower leg:  No edema.     Left lower leg: No edema.  Lymphadenopathy:     Upper Body:     Right upper body: No supraclavicular, axillary or pectoral adenopathy.     Left upper body: No supraclavicular, axillary or pectoral adenopathy.  Skin:    General: Skin is warm and dry.     Capillary Refill: Capillary refill takes less than 2 seconds.  Neurological:     General: No focal deficit present.     Mental Status: She is alert and oriented to person, place, and time.     Cranial Nerves: No cranial nerve deficit.     Sensory: No sensory deficit.     Motor: No weakness.  Psychiatric:        Mood and Affect: Mood normal.        Behavior: Behavior normal.        Thought Content: Thought content normal.        Judgment: Judgment normal.         Assessment And Plan:     Encounter for annual health examination Assessment & Plan: Behavior modifications discussed and diet history reviewed.   Pt will continue to exercise regularly and modify diet with low GI, plant based foods and decrease intake of processed foods.  Recommend intake of daily multivitamin, Vitamin D, and calcium.  Recommend mammogram for preventive screenings, as well as recommend immunizations that include influenza, TDAP    Type 2 diabetes mellitus with obesity (HCC) Assessment & Plan: Continue current medications, HgbA1c is improving. Will check HgbA1c. SR HR 87, no acute findings  Orders: -     POCT URINALYSIS DIP (CLINITEK) -     Microalbumin / creatinine urine ratio -     EKG 12-Lead -     CMP14+EGFR -     Hemoglobin  A1c -     Lipid panel -     Mounjaro ; Inject 15 mg into the skin once a week.  Dispense: 6 mL; Refill: 1  Pneumococcal vaccination declined  Class 3 severe obesity due to excess calories with body mass index (BMI) of 45.0 to 49.9 in adult, unspecified whether serious comorbidity present  Other long term (current) drug therapy -     CBC  Muscle tension pain -     Diclofenac  Sodium; Apply 2 g topically 4 (four) times daily.  Dispense: 150 g; Refill: 2 -     Cyclobenzaprine  HCl; Take 1 tablet (10 mg total) by mouth 3 (three) times daily as needed.  Dispense: 30 tablet; Refill: 0    Return for 1 year physical. Patient was given opportunity to ask questions. Patient verbalized understanding of the plan and was able to repeat key elements of the plan. All questions were answered to their satisfaction.   Dorothy Epley, FNP   I, Dorothy Epley, FNP, have reviewed all documentation for this visit. The documentation on 01/20/24 for the exam, diagnosis, procedures, and orders are all accurate and complete.

## 2024-01-20 NOTE — Patient Instructions (Signed)

## 2024-01-21 LAB — MICROALBUMIN / CREATININE URINE RATIO
Creatinine, Urine: 144.4 mg/dL
Microalb/Creat Ratio: 30 mg/g{creat} — ABNORMAL HIGH (ref 0–29)
Microalbumin, Urine: 42.7 ug/mL

## 2024-01-21 LAB — CMP14+EGFR
ALT: 16 IU/L (ref 0–32)
AST: 16 IU/L (ref 0–40)
Albumin: 4.3 g/dL (ref 3.9–4.9)
Alkaline Phosphatase: 52 IU/L (ref 44–121)
BUN/Creatinine Ratio: 13 (ref 9–23)
BUN: 17 mg/dL (ref 6–24)
Bilirubin Total: 0.3 mg/dL (ref 0.0–1.2)
CO2: 21 mmol/L (ref 20–29)
Calcium: 9.6 mg/dL (ref 8.7–10.2)
Chloride: 100 mmol/L (ref 96–106)
Creatinine, Ser: 1.32 mg/dL — ABNORMAL HIGH (ref 0.57–1.00)
Globulin, Total: 2.6 g/dL (ref 1.5–4.5)
Glucose: 85 mg/dL (ref 70–99)
Potassium: 4.4 mmol/L (ref 3.5–5.2)
Sodium: 137 mmol/L (ref 134–144)
Total Protein: 6.9 g/dL (ref 6.0–8.5)
eGFR: 51 mL/min/{1.73_m2} — ABNORMAL LOW (ref >59)

## 2024-01-21 LAB — CBC
Hematocrit: 39.2 % (ref 34.0–46.6)
Hemoglobin: 12.3 g/dL (ref 11.1–15.9)
MCH: 30.2 pg (ref 26.6–33.0)
MCHC: 31.4 g/dL — ABNORMAL LOW (ref 31.5–35.7)
MCV: 96 fL (ref 79–97)
Platelets: 285 10*3/uL (ref 150–450)
RBC: 4.07 x10E6/uL (ref 3.77–5.28)
RDW: 14.5 % (ref 11.7–15.4)
WBC: 5 10*3/uL (ref 3.4–10.8)

## 2024-01-21 LAB — HEMOGLOBIN A1C
Est. average glucose Bld gHb Est-mCnc: 100 mg/dL
Hgb A1c MFr Bld: 5.1 % (ref 4.8–5.6)

## 2024-01-21 LAB — LIPID PANEL
Chol/HDL Ratio: 2.8 ratio (ref 0.0–4.4)
Cholesterol, Total: 193 mg/dL (ref 100–199)
HDL: 69 mg/dL (ref >39)
LDL Chol Calc (NIH): 107 mg/dL — ABNORMAL HIGH (ref 0–99)
Triglycerides: 95 mg/dL (ref 0–149)
VLDL Cholesterol Cal: 17 mg/dL (ref 5–40)

## 2024-01-22 ENCOUNTER — Other Ambulatory Visit (HOSPITAL_COMMUNITY): Payer: Self-pay

## 2024-01-25 ENCOUNTER — Other Ambulatory Visit: Payer: Self-pay | Admitting: Nurse Practitioner

## 2024-01-25 ENCOUNTER — Ambulatory Visit: Payer: Self-pay | Admitting: Nurse Practitioner

## 2024-01-25 DIAGNOSIS — E669 Obesity, unspecified: Secondary | ICD-10-CM | POA: Insufficient documentation

## 2024-01-25 DIAGNOSIS — Z0000 Encounter for general adult medical examination without abnormal findings: Secondary | ICD-10-CM | POA: Insufficient documentation

## 2024-01-25 DIAGNOSIS — Z794 Long term (current) use of insulin: Secondary | ICD-10-CM

## 2024-01-25 DIAGNOSIS — M791 Myalgia, unspecified site: Secondary | ICD-10-CM | POA: Insufficient documentation

## 2024-01-25 NOTE — Assessment & Plan Note (Addendum)
 Continue current medications, HgbA1c is improving. Will check HgbA1c. SR HR 87, no acute findings

## 2024-01-25 NOTE — Assessment & Plan Note (Signed)
 Behavior modifications discussed and diet history reviewed.   Pt will continue to exercise regularly and modify diet with low GI, plant based foods and decrease intake of processed foods.  Recommend intake of daily multivitamin, Vitamin D, and calcium.  Recommend mammogram for preventive screenings, as well as recommend immunizations that include influenza, TDAP

## 2024-01-27 ENCOUNTER — Other Ambulatory Visit (HOSPITAL_COMMUNITY): Payer: Self-pay

## 2024-03-15 DIAGNOSIS — G4733 Obstructive sleep apnea (adult) (pediatric): Secondary | ICD-10-CM | POA: Diagnosis not present

## 2024-03-15 DIAGNOSIS — Z3482 Encounter for supervision of other normal pregnancy, second trimester: Secondary | ICD-10-CM | POA: Diagnosis not present

## 2024-03-15 DIAGNOSIS — Z3483 Encounter for supervision of other normal pregnancy, third trimester: Secondary | ICD-10-CM | POA: Diagnosis not present

## 2024-03-18 NOTE — Unmapped External Note (Signed)
 Assessment Note   Demographics Verification - Call Monitoring - Confidentiality      Member Verification: Member Verification    Call Monitoring Disclaimer: Call Monitoring Disclaimer         Person Providing Info on the Call   Who is the person providing the information on the call? Member      Limitations/Preferences   What, if any, physical limitations, health literacy, language needs and learning preferences do you have that I should be aware of when we talk?        Specify other language limitations        Specify other physical limitations        [facility] Contact Type   Select the Health Your Way contact type: 4th or more contact-Telephonic Lifestyle Coaching           Lifestyle Coaching Weight Management Follow Up                    General Health Perception   How would you describe your health in general? My health is good      SMART GOAL & SMART GOAL Follow Up     SMART GOAL    Did the member set a SMART goal? Yes  Did the member meet their previous SMART goal?  Yes      Brief Summary of Member Status   What medical conditions has your doctor diagnosed you with?    Past Medical History:  Diagnosis Date  . Chronic kidney disease   . Diabetes mellitus (CMS/HCC and HHS/HCC)      Did the member score via Care Engine for any condition(s) that he/she did NOT confirm?    HYW:  What medical conditions are you most concerned about and why?    In between MD visits people often find a need to go to the ER or an Urgent Care facility.can you tell me if you had any concerns or issues in which you went to an ER or Urgent care in the last 12 months?      Condition Specific Metrics   Height/Weight/BMI Height: 5' 5/Weight: 282 lb/BMI (Calculated): 46.9   Blood Pressure    Outcome Metrics      Medications    Current Outpatient Medications:  .  tirzepatide  (Mounjaro ) 12.5 mg/0.5 mL pnij, Inject 12.5 mg subcutaneously, Disp: , Rfl:      There are no discontinued medications.     Medication Review         Depression Screening PHQ2/PHQ9 Exclusions   PHQ2 EXCLUSION CRITERIA:  Do NOT administer the PHQ-2 if any of the following apply to this member.   PHQ9 EXCLUSION CRITERIA: To determine if this assessment is right for you, I need to ask you some questions before we start.  Besides depression, have you been diagnosed with any other mental health issues such as: No applicable exclusions              Depression PHQ2/PHQ9 Screening Results     PHQ2  The PHQ-2 questions are scored from 0 (not at all) to 3 (nearly every day) and then added together: Score 0-2 = Not likely to be at risk for depression, Score 3-6 = At risk for depression (further screening recommended).        PHQ9 PHQ-9 Mini Module-If diagnosis of depression- show confirmed dx of depression and show PHQ-9 score & score description        Social Drivers of Health   Tobacco  Use: Low Risk  (02/05/2024)   Patient History   . Smoking Tobacco Use: Never   . Smokeless Tobacco Use: Never  Physical Activity: Insufficiently Active (10/23/2023)   Exercise Vital Sign   . Days of Exercise per Week: 2 days   . Minutes of Exercise per Session: 30 min      Lab Results   Results     There are no results available from this visit.         Immunizations   Immunizations Mini Module (show all Q/A pairs answered during this encounter)       Intervention/Actions   Education Topics Discussed      Referrals & Referral Follow Up   Referrals    Flowsheet Row Most Recent Value  Member's Healthcare Provider   [facility] Healthcare Provider - Did member adhere to referral recommendation? Yes  Member's Healthcare Provider - What was the results of the referral? Hasn't seen her doctor yet.  Please document any referrals to other programs or resources that were made by the staff: None During This Contact            MEP/Mobile App  Registration & Education on Tools/Resources   Is the member registered on the Member Engagement Platform (MEP) or Mobile App?  Explain tools and resources available on Member Engagement Platform [employer]) and how to get the most benefit out of them.  Indicate resources reviewed with member:   11/21/2023         Next Scheduled Appointment      Date Provider Department Visit Type   04/29/2024 11:30 AM Dorothy Sullivan Care Management [facility] Nurse Follow Up Assessment- 1st Attempt   06/17/2024 12:00 PM Dorothy Sullivan Care Management [facility] Coach Follow Up Assmt- 1st Attempt         Follow Up Needs Identified   Lifestyle Coaching Addendum: Account: [place] Account Phone Number: [phone] Member Name: Dorothy Sullivan Time Zone: PC[]  MT[]  CT[]  ET[x]  CALLTYPE: FOLLOW UP CALL  Update PMH, PHI, Limitations,  Disclaimers, & MI Questions annually:  04/02/2023  Review & Document any relevant  SDOH factors affecting member's health/goals: YESNO: YES No Markers  Next PHQ2:  04/22/24  Health Conditions per Member:  has a past medical history of Chronic kidney disease and Diabetes mellitus (CMS/HCC and HHS/HCC).  Member Concerns (Focus/Agenda of Call): CALLTOPIC: WEIGHT MANAGEMENT  Mbr. states: she is 3# short of her goal. She states she has done well on the exercise. She states she is still doing the [employer] and walking on the walking pad. She states she hasn't had any red meat except for July 4th. Her doctor referred her to the kidney clinic, but no one called her. Sees her doctor again Oct. 13th. She states she doesn't feel any different. I've never had high blood pressure. She states she has 25-30# left to lose by January. She states the IF helps, not eating after 5 or 6 PM. Going on vacation at the end of the month.   Member's Why: To prevent CKD from progressing   Ht./Wt./BMI : 5' 5 282 lb Body mass index is 46.93 kg/m.   Motivation Level: 10  Briefly summarize the  coaching/education you provided during this call:  Interventions addressed/Education Provided: Used Motivational Interviewing techniques to explore the the member's readiness to change Provided reinforcement for positive changes or efforts made by the member  Reviewed progress on previous SMART goals (if applicable) Addressed barriers to change  Supported and promoted self-efficacy Enc. member to  drink at least 64 oz. of water a day (discussed the importance of water on overall health), Enc. member to avoid skipping meals (discussed the effect on satiety, weight, and blood sugar), and Reinforced the importance of exercise and physical activity on overall health Affirmed member's progress Congratulated member on making healthy lifestyle changes  Establish SMART goal by the 2nd call: GOAL_STATUS: NEW GOAL SET Mbr. will lose at least 13# by the next time we talk by working out 5 days/week for at least 30 minutes (walking pad, YouTube videos) starting next Tuesday and continuing through our next conversation.   Barriers: Just laziness   Confidence Level: 8  Referred to Member Engagement Platform/Mobile App.   Plan for next call  (Follow up on health gaps/barriers identified on this interaction, [facility] Focus to be discussed on next call):  Topic of Discussion Next Call: Weight Management  Follow-up with member on SMART Goal set on today's call  Review weight/goals (if applicable) Follow-up MEP/MAH and/or Mobile app registration and messaging  Coach Checklist: Mbr. Identity Verified: YESNO: YES Disclaimers Provided: HIPAA, GINA, Call Monitoring: YESNO: YES Asked mbr. about pregnancy status/terminally ill status (only on IA): YESNO: NO MI Questions Completed: YESNO: NO MH Pre Completed:YESNO: YES MH Post Completed: YESNO: YES PHQ2: PHQ_STATUS: NOT REQUIRED ON THIS CALL Tobacco Use: YESNO: NO BMI and Weight Management: YESNO: YES SMART Goal Status: GOAL_STATUS: NEW GOAL SET Referred to  MEP/App and any other Referrals Necessary: YESNO: YES

## 2024-04-08 NOTE — Telephone Encounter (Signed)
 Can we f/u on this I believe it has been done but not sure.

## 2024-04-12 DIAGNOSIS — N1831 Chronic kidney disease, stage 3a: Secondary | ICD-10-CM | POA: Diagnosis not present

## 2024-04-12 DIAGNOSIS — R7989 Other specified abnormal findings of blood chemistry: Secondary | ICD-10-CM | POA: Diagnosis not present

## 2024-04-12 DIAGNOSIS — R030 Elevated blood-pressure reading, without diagnosis of hypertension: Secondary | ICD-10-CM | POA: Diagnosis not present

## 2024-04-15 DIAGNOSIS — G4733 Obstructive sleep apnea (adult) (pediatric): Secondary | ICD-10-CM | POA: Diagnosis not present

## 2024-05-16 DIAGNOSIS — G4733 Obstructive sleep apnea (adult) (pediatric): Secondary | ICD-10-CM | POA: Diagnosis not present

## 2024-05-31 ENCOUNTER — Encounter: Payer: Self-pay | Admitting: Nurse Practitioner

## 2024-05-31 ENCOUNTER — Ambulatory Visit: Payer: Self-pay | Admitting: Nurse Practitioner

## 2024-05-31 ENCOUNTER — Other Ambulatory Visit (HOSPITAL_COMMUNITY): Payer: Self-pay

## 2024-05-31 VITALS — BP 120/78 | HR 85 | Temp 98.5°F | Ht 65.0 in | Wt 284.2 lb

## 2024-05-31 DIAGNOSIS — Z794 Long term (current) use of insulin: Secondary | ICD-10-CM | POA: Diagnosis not present

## 2024-05-31 DIAGNOSIS — Z2821 Immunization not carried out because of patient refusal: Secondary | ICD-10-CM

## 2024-05-31 DIAGNOSIS — Z139 Encounter for screening, unspecified: Secondary | ICD-10-CM | POA: Diagnosis not present

## 2024-05-31 DIAGNOSIS — Z1211 Encounter for screening for malignant neoplasm of colon: Secondary | ICD-10-CM | POA: Diagnosis not present

## 2024-05-31 DIAGNOSIS — K5904 Chronic idiopathic constipation: Secondary | ICD-10-CM | POA: Diagnosis not present

## 2024-05-31 DIAGNOSIS — N1831 Chronic kidney disease, stage 3a: Secondary | ICD-10-CM

## 2024-05-31 DIAGNOSIS — E1122 Type 2 diabetes mellitus with diabetic chronic kidney disease: Secondary | ICD-10-CM

## 2024-05-31 MED ORDER — LINACLOTIDE 72 MCG PO CAPS
72.0000 ug | ORAL_CAPSULE | Freq: Every day | ORAL | 1 refills | Status: AC
  Filled 2024-05-31: qty 90, 90d supply, fill #0

## 2024-05-31 NOTE — Progress Notes (Signed)
 LILLETTE Kristeen JINNY Gladis, CMA,acting as a Neurosurgeon for Gaines Ada, FNP.,have documented all relevant documentation on the behalf of Gaines Ada, FNP,as directed by  Gaines Ada, FNP while in the presence of Gaines Ada, FNP.  Subjective:  [name] , female    DOB: [birth year] , 43 y.o.   MRN: [id number]  Chief Complaint  Patient presents with   Diabetes    Patient presents today for a dm follow up, Patient reports compliance with medication. Patient denies any chest pain, SOB, or headaches. Patient has no concerns today.      HPI  Discussed the use of AI scribe software for clinical note transcription with the patient, who gave verbal consent to proceed.  History of Present Illness Dorothy Sullivan is a 43 year old female who presents for a follow-up visit for constipation.  She has been experiencing constipation for the past two months. Despite regular use of Miralax, which was previously effective, it is no longer providing relief. She consumes nine to ten 16-ounce bottles of water daily and has increased her water intake since her last visit. She has also eliminated red meat from her diet after being referred to a kidney specialist and being told about possible kidney disease. Despite these changes, she continues to feel the urge to have a bowel movement without success.  She has a past history of bleeding ulcers at age 41, attributed to grief. She underwent regular colonoscopies for several years following this diagnosis but has not had one in over ten years.  Her current medications include Mounjaro . She has a history of diabetes, which necessitates regular diabetic eye exams.   Past Medical History:  Diagnosis Date   Benign essential hypertension    BV (bacterial vaginosis) 12/14/2012   Flagyl  Rx.     Dysuria    Elevated blood pressure reading 01/08/2022   Gastritis    Insomnia    Lower extremity edema 01/08/2022   Malaise and fatigue    Malignant essential  hypertension    Morbid obesity (HCC)    Panic disorder    Vitamin D deficiency      Family History  Problem Relation Age of Onset   Kidney disease Father    Aneurysm Father    Migraines Sister    Kidney disease Sister    Kidney disease Paternal Grandmother    Heart disease Paternal Grandfather      Current Outpatient Medications:    blood glucose meter kit and supplies KIT, Use up to four times daily as directed., Disp: 1 each, Rfl: 0   cyclobenzaprine  (FLEXERIL ) 10 MG tablet, Take 1 tablet (10 mg total) by mouth 3 (three) times daily as needed., Disp: 30 tablet, Rfl: 0   diclofenac  Sodium (VOLTAREN ) 1 % GEL, Apply 2 g topically 4 (four) times daily., Disp: 150 g, Rfl: 2   glucose blood test strip, Use up to four times daily as directed., Disp: 100 each, Rfl: 0   Lancets (FREESTYLE) lancets, Use up to four times daily as directed., Disp: 100 each, Rfl: 0   linaclotide (LINZESS) 72 MCG capsule, Take 1 capsule (72 mcg total) by mouth daily before breakfast., Disp: 90 capsule, Rfl: 1   tirzepatide  (MOUNJARO ) 15 MG/0.5ML Pen, Inject 15 mg into the skin once a week., Disp: 6 mL, Rfl: 1   influenza vac split trivalent PF (FLUZONE) 0.5 ML injection, Inject into the muscle., Disp: 0.5 mL, Rfl: 0   solifenacin  (VESICARE ) 10 MG tablet, Take 1 tablet (  10 mg total) by mouth daily. (Patient not taking: Reported on 05/31/2024), Disp: 30 tablet, Rfl: 2   No Known Allergies   Review of Systems  Constitutional: Negative.   HENT: Negative.    Eyes: Negative.   Respiratory: Negative.    Cardiovascular: Negative.   Gastrointestinal: Negative.   Musculoskeletal: Negative.   Neurological: Negative.   Psychiatric/Behavioral: Negative.       Today's Vitals   05/31/24 1519  BP: 120/78  Pulse: 85  Temp: 98.5 F (36.9 C)  TempSrc: Oral  Weight: 284 lb 3.2 oz (128.9 kg)  Height: 5' 5 (1.651 m)  PainSc: 0-No pain   Body mass index is 47.29 kg/m.  Wt Readings from Last 3 Encounters:   05/31/24 284 lb 3.2 oz (128.9 kg)  01/20/24 300 lb (136.1 kg)  09/18/23 (!) 307 lb (139.3 kg)    The 10-year ASCVD risk score (Arnett DK, et al., 2019) is: 1.2%   Values used to calculate the score:     Age: 27 years     Clincally relevant sex: Female     Is Non-Hispanic African American: Yes     Diabetic: Yes     Tobacco smoker: No     Systolic Blood Pressure: 120 mmHg     Is BP treated: No     HDL Cholesterol: 69 mg/dL     Total Cholesterol: 193 mg/dL  Objective:  Physical Exam Vitals and nursing note reviewed.  Constitutional:      General: She is not in acute distress.    Appearance: Normal appearance. She is obese.  Cardiovascular:     Rate and Rhythm: Normal rate and regular rhythm.     Pulses: Normal pulses.     Heart sounds: Normal heart sounds. No murmur heard. Pulmonary:     Effort: Pulmonary effort is normal. No respiratory distress.     Breath sounds: Normal breath sounds. No wheezing.  Musculoskeletal:        General: No swelling or tenderness.     Right lower leg: No edema.     Left lower leg: No edema.  Skin:    General: Skin is warm and dry.     Capillary Refill: Capillary refill takes less than 2 seconds.  Neurological:     General: No focal deficit present.     Mental Status: She is alert and oriented to person, place, and time.     Cranial Nerves: No cranial nerve deficit.     Motor: No weakness.  Psychiatric:        Mood and Affect: Mood normal.        Behavior: Behavior normal.        Thought Content: Thought content normal.        Judgment: Judgment normal.      Assessment And Plan:  Type 2 diabetes mellitus with stage 3a chronic kidney disease, with long-term current use of insulin  (HCC) Assessment & Plan: Recent dietary changes include eliminating red meat. Emphasized the importance of diabetic eye exams due to potential retinal complications. - Refer to ophthalmology for a diabetic eye exam. - Order A1c and kidney function  tests.  Orders: -     Hemoglobin A1c -     BMP8+eGFR -     Ambulatory referral to Ophthalmology  Influenza vaccination declined  Encounter for screening -     Hepatitis B surface antibody,qualitative  Chronic idiopathic constipation Assessment & Plan: Chronic constipation with recent ineffectiveness of Miralax. Discussed Linzess as an alternative  to aid bowel movements. Adequate hydration reported. - Prescribe Linzess to be taken once daily on an empty stomach at least 30 minutes before a meal. - Provide a coupon card for Linzess to reduce cost. - Schedule follow-up in six weeks to assess Linzess effectiveness.  Orders: -     3D Screening Mammogram, Left and Right; Future -     linaCLOtide; Take 1 capsule (72 mcg total) by mouth daily before breakfast.  Dispense: 90 capsule; Refill: 1  Encounter for screening colonoscopy -     Ambulatory referral to Gastroenterology    Return for controlled DM check 4 months; 6 week medication f/u.  Patient was given opportunity to ask questions. Patient verbalized understanding of the plan and was able to repeat key elements of the plan. All questions were answered to their satisfaction.    LILLETTE Gaines Ada, FNP, have reviewed all documentation for this visit. The documentation on 05/31/24 for the exam, diagnosis, procedures, and orders are all accurate and complete.   IF YOU HAVE BEEN REFERRED TO A SPECIALIST, IT MAY TAKE 1-2 WEEKS TO SCHEDULE/PROCESS THE REFERRAL. IF YOU HAVE NOT HEARD FROM US /SPECIALIST IN TWO WEEKS, PLEASE GIVE US  A CALL AT [phone].

## 2024-06-01 LAB — BMP8+EGFR
BUN/Creatinine Ratio: 12 (ref 9–23)
BUN: 11 mg/dL (ref 6–24)
CO2: 20 mmol/L (ref 20–29)
Calcium: 9.3 mg/dL (ref 8.7–10.2)
Chloride: 101 mmol/L (ref 96–106)
Creatinine, Ser: 0.89 mg/dL (ref 0.57–1.00)
Glucose: 86 mg/dL (ref 70–99)
Potassium: 4.2 mmol/L (ref 3.5–5.2)
Sodium: 136 mmol/L (ref 134–144)
eGFR: 81 mL/min/1.73 (ref >59)

## 2024-06-01 LAB — HEPATITIS B SURFACE ANTIBODY,QUALITATIVE: Hep B Surface Ab, Qual: NONREACTIVE

## 2024-06-01 LAB — HEMOGLOBIN A1C
Est. average glucose Bld gHb Est-mCnc: 103 mg/dL
Hgb A1c MFr Bld: 5.2 % (ref 4.8–5.6)

## 2024-06-04 ENCOUNTER — Other Ambulatory Visit (HOSPITAL_COMMUNITY): Payer: Self-pay

## 2024-06-04 MED ORDER — FLUZONE 0.5 ML IM SUSY
PREFILLED_SYRINGE | INTRAMUSCULAR | 0 refills | Status: AC
  Filled 2024-06-04: qty 0.5, 1d supply, fill #0

## 2024-06-06 ENCOUNTER — Ambulatory Visit: Payer: Self-pay | Admitting: Nurse Practitioner

## 2024-06-06 DIAGNOSIS — K5904 Chronic idiopathic constipation: Secondary | ICD-10-CM | POA: Insufficient documentation

## 2024-06-06 DIAGNOSIS — N1831 Chronic kidney disease, stage 3a: Secondary | ICD-10-CM | POA: Insufficient documentation

## 2024-06-06 NOTE — Assessment & Plan Note (Signed)
 Chronic constipation with recent ineffectiveness of Miralax. Discussed Linzess as an alternative to aid bowel movements. Adequate hydration reported. - Prescribe Linzess to be taken once daily on an empty stomach at least 30 minutes before a meal. - Provide a coupon card for Linzess to reduce cost. - Schedule follow-up in six weeks to assess Linzess effectiveness.

## 2024-06-06 NOTE — Assessment & Plan Note (Signed)
 Recent dietary changes include eliminating red meat. Emphasized the importance of diabetic eye exams due to potential retinal complications. - Refer to ophthalmology for a diabetic eye exam. - Order A1c and kidney function tests.

## 2024-06-09 ENCOUNTER — Ambulatory Visit

## 2024-07-04 DIAGNOSIS — E119 Type 2 diabetes mellitus without complications: Secondary | ICD-10-CM | POA: Diagnosis not present

## 2024-07-04 DIAGNOSIS — H5213 Myopia, bilateral: Secondary | ICD-10-CM | POA: Diagnosis not present

## 2024-07-05 ENCOUNTER — Other Ambulatory Visit (HOSPITAL_COMMUNITY): Payer: Self-pay

## 2024-07-05 ENCOUNTER — Encounter: Payer: Self-pay | Admitting: Obstetrics

## 2024-07-05 ENCOUNTER — Ambulatory Visit
Admission: RE | Admit: 2024-07-05 | Discharge: 2024-07-05 | Disposition: A | Discharge status: Home / Self Care | Source: Ambulatory Visit | Attending: Nurse Practitioner | Admitting: Nurse Practitioner

## 2024-07-05 ENCOUNTER — Encounter: Payer: Self-pay | Admitting: Nurse Practitioner

## 2024-07-05 ENCOUNTER — Other Ambulatory Visit (HOSPITAL_COMMUNITY)
Admission: RE | Admit: 2024-07-05 | Discharge: 2024-07-05 | Disposition: A | Discharge status: Home / Self Care | Source: Ambulatory Visit | Attending: Obstetrics and Gynecology | Admitting: Obstetrics and Gynecology

## 2024-07-05 ENCOUNTER — Ambulatory Visit (INDEPENDENT_AMBULATORY_CARE_PROVIDER_SITE_OTHER): Admitting: Obstetrics

## 2024-07-05 VITALS — BP 173/103 | HR 71 | Ht 65.0 in | Wt 283.3 lb

## 2024-07-05 DIAGNOSIS — R030 Elevated blood-pressure reading, without diagnosis of hypertension: Secondary | ICD-10-CM

## 2024-07-05 DIAGNOSIS — Z113 Encounter for screening for infections with a predominantly sexual mode of transmission: Secondary | ICD-10-CM | POA: Insufficient documentation

## 2024-07-05 DIAGNOSIS — N951 Menopausal and female climacteric states: Secondary | ICD-10-CM

## 2024-07-05 DIAGNOSIS — E66813 Obesity, class 3: Secondary | ICD-10-CM

## 2024-07-05 DIAGNOSIS — Z1231 Encounter for screening mammogram for malignant neoplasm of breast: Secondary | ICD-10-CM | POA: Diagnosis not present

## 2024-07-05 DIAGNOSIS — Z01419 Encounter for gynecological examination (general) (routine) without abnormal findings: Secondary | ICD-10-CM | POA: Diagnosis present

## 2024-07-05 DIAGNOSIS — Z6841 Body Mass Index (BMI) 40.0 and over, adult: Secondary | ICD-10-CM | POA: Diagnosis not present

## 2024-07-05 DIAGNOSIS — K5904 Chronic idiopathic constipation: Secondary | ICD-10-CM

## 2024-07-05 MED ORDER — CLIMARA PRO 0.045-0.015 MG/DAY TD PTWK
1.0000 | MEDICATED_PATCH | TRANSDERMAL | 12 refills | Status: DC
  Filled 2024-07-05: qty 4, 28d supply, fill #0

## 2024-07-05 NOTE — Progress Notes (Addendum)
 Subjective:        Dorothy Sullivan is a 43 y.o. female here for a routine exam.  Current complaints: None.    Personal health questionnaire:  Is patient Ashkenazi Jewish, have a family history of breast and/or ovarian cancer: no Is there a family history of uterine cancer diagnosed at age < 84, gastrointestinal cancer, urinary tract cancer, family member who is a Personnel Officer syndrome-associated carrier: no Is the patient overweight and hypertensive, family history of diabetes, personal history of gestational diabetes, preeclampsia or PCOS: no Is patient over 41, have PCOS,  family history of premature CHD under age 18, diabetes, smoke, have hypertension or peripheral artery disease:  no At any time, has a partner hit, kicked or otherwise hurt or frightened you?: no Over the past 2 weeks, have you felt down, depressed or hopeless?: no Over the past 2 weeks, have you felt little interest or pleasure in doing things?:no   Gynecologic History Patient's last menstrual period was 06/21/2024 (approximate). Contraception: tubal ligation Last Pap: 12-10-2022. Results were: normal Last mammogram: 11-06-2022. Results were: normal  Obstetric History OB History  Gravida Para Term Preterm AB Living  1 1 1   1   SAB IAB Ectopic Multiple Live Births      1    # Outcome Date GA Lbr Len/2nd Weight Sex Type Anes PTL Lv  1 Term 02/19/99    F CS-LTranv   LIV    Past Medical History:  Diagnosis Date   Benign essential hypertension    BV (bacterial vaginosis) 12/14/2012   Flagyl  Rx.     Dysuria    Elevated blood pressure reading 01/08/2022   Gastritis    Insomnia    Lower extremity edema 01/08/2022   Malaise and fatigue    Malignant essential hypertension    Morbid obesity (HCC)    Panic disorder    Vitamin D deficiency     Past Surgical History:  Procedure Laterality Date   CESAREAN SECTION     TONSILLECTOMY     TUBAL LIGATION       Current Outpatient Medications:    diclofenac   Sodium (VOLTAREN ) 1 % GEL, Apply 2 g topically 4 (four) times daily., Disp: 150 g, Rfl: 2   linaclotide (LINZESS) 72 MCG capsule, Take 1 capsule (72 mcg total) by mouth daily before breakfast., Disp: 90 capsule, Rfl: 1   tirzepatide  (MOUNJARO ) 15 MG/0.5ML Pen, Inject 15 mg into the skin once a week., Disp: 6 mL, Rfl: 1   blood glucose meter kit and supplies KIT, Use up to four times daily as directed. (Patient not taking: Reported on 07/05/2024), Disp: 1 each, Rfl: 0   cyclobenzaprine  (FLEXERIL ) 10 MG tablet, Take 1 tablet (10 mg total) by mouth 3 (three) times daily as needed., Disp: 30 tablet, Rfl: 0   glucose blood test strip, Use up to four times daily as directed. (Patient not taking: Reported on 07/05/2024), Disp: 100 each, Rfl: 0   influenza vac split trivalent PF (FLUZONE) 0.5 ML injection, Inject into the muscle., Disp: 0.5 mL, Rfl: 0   Lancets (FREESTYLE) lancets, Use up to four times daily as directed. (Patient not taking: Reported on 07/05/2024), Disp: 100 each, Rfl: 0   solifenacin  (VESICARE ) 10 MG tablet, Take 1 tablet (10 mg total) by mouth daily. (Patient not taking: Reported on 07/05/2024), Disp: 30 tablet, Rfl: 2 No Known Allergies  Social History   Tobacco Use   Smoking status: Never   Smokeless tobacco: Never  Substance  Use Topics   Alcohol use: Yes    Alcohol/week: 0.0 standard drinks of alcohol    Comment: occasional    Family History  Problem Relation Age of Onset   Kidney disease Father    Aneurysm Father    Migraines Sister    Kidney disease Sister    Kidney disease Paternal Grandmother    Heart disease Paternal Grandfather    Breast cancer Neg Hx       Review of Systems  Constitutional: negative for fatigue and weight loss Respiratory: negative for cough and wheezing Cardiovascular: negative for chest pain, fatigue and palpitations Gastrointestinal: negative for abdominal pain and change in bowel habits Musculoskeletal:negative for  myalgias Neurological: negative for gait problems and tremors Behavioral/Psych: negative for abusive relationship, depression Endocrine: negative for temperature intolerance    Genitourinary:negative for abnormal menstrual periods, genital lesions, hot flashes, sexual problems and vaginal discharge Integument/breast: negative for breast lump, breast tenderness, nipple discharge and skin lesion(s)    Objective:       BP (!) 173/103   Pulse 71   Ht 5' 5 (1.651 m)   Wt 283 lb 4.8 oz (128.5 kg)   LMP 06/21/2024 (Approximate)   BMI 47.14 kg/m  General:   Alert and no distress  Skin:   no rash or abnormalities  Lungs:   clear to auscultation bilaterally  Heart:   regular rate and rhythm, S1, S2 normal, no murmur, click, rub or gallop  Breasts:   normal without suspicious masses, skin or nipple changes or axillary nodes  Abdomen:  normal findings: no organomegaly, soft, non-tender and no hernia  Pelvis:  External genitalia: normal general appearance Urinary system: urethral meatus normal and bladder without fullness, nontender Vaginal: normal without tenderness, induration or masses Cervix: normal appearance Adnexa: normal bimanual exam Uterus: anteverted and non-tender, normal size   Lab Review Urine pregnancy test Labs reviewed yes Radiologic studies reviewed no  I have spent a total of 20 minutes of face-to-face time, excluding clinical staff time, reviewing notes and preparing to see patient, ordering tests and/or medications, and counseling the patient.   Assessment:    1. Encounter for gynecological examination with Papanicolaou smear of cervix (Primary) Rx: - Cytology - PAP( [place]) - Cervicovaginal ancillary only( [place]) - HIV antibody (with reflex) - Hepatitis C Antibody - Hepatitis B Surface AntiGEN - RPR  2. Perimenopause  3. Menopausal syndrome (hot flashes) - patient has elevated BP, and is not a candidate for E2  4. Class 3 severe obesity due  to excess calories without serious comorbidity with body mass index (BMI) of 45.0 to 49.9 in adult [facility]) - managed by PCP  5. Elevated BP without a diagnosis of Hypertension - patient states that BP is only elevated in this office - note requested from PCP for clearance before starting E2 for hot flashes    Plan:    Education reviewed: calcium supplements, depression evaluation, low fat, low cholesterol diet, safe sex/STD prevention, self breast exams, and weight bearing exercise. Follow up in: 1 year.   Meds ordered this encounter  Medications   DISCONTD: estradiol-levonorgestrel (CLIMARA PRO) 0.045-0.015 MG/DAY    Sig: Place 1 patch onto the skin once a week.    Dispense:  4 patch    Refill:  12   Orders Placed This Encounter  Procedures   HIV antibody (with reflex)   Hepatitis C Antibody   Hepatitis B Surface AntiGEN   RPR   CARLIN RONAL CENTERS, MD, FACOG  Attending Obstetrician & Gynecologist, [facility] for [facility], [facility], [place] 07/05/2024

## 2024-07-05 NOTE — Progress Notes (Signed)
 Pt presents for annual. pt has no questions or concerns at this time.

## 2024-07-05 NOTE — Addendum Note (Signed)
 Addended by: RUDY DUNNINGS A on: 07/05/2024 04:07 PM   Modules accepted: Orders

## 2024-07-06 LAB — CERVICOVAGINAL ANCILLARY ONLY
Bacterial Vaginitis (gardnerella): POSITIVE — AB
Candida Glabrata: NEGATIVE
Candida Vaginitis: NEGATIVE
Chlamydia: NEGATIVE
Comment: NEGATIVE
Comment: NEGATIVE
Comment: NEGATIVE
Comment: NEGATIVE
Comment: NEGATIVE
Comment: NORMAL
Neisseria Gonorrhea: NEGATIVE
Trichomonas: POSITIVE — AB

## 2024-07-06 LAB — HEPATITIS B SURFACE ANTIGEN: Hepatitis B Surface Ag: NEGATIVE

## 2024-07-06 LAB — HIV ANTIBODY (ROUTINE TESTING W REFLEX): HIV Screen 4th Generation wRfx: NONREACTIVE

## 2024-07-06 LAB — RPR: RPR Ser Ql: NONREACTIVE

## 2024-07-06 LAB — HEPATITIS C ANTIBODY: Hep C Virus Ab: NONREACTIVE

## 2024-07-07 ENCOUNTER — Ambulatory Visit: Payer: Self-pay | Admitting: Obstetrics

## 2024-07-07 ENCOUNTER — Other Ambulatory Visit (HOSPITAL_COMMUNITY): Payer: Self-pay

## 2024-07-07 DIAGNOSIS — B9689 Other specified bacterial agents as the cause of diseases classified elsewhere: Secondary | ICD-10-CM

## 2024-07-07 DIAGNOSIS — Z1211 Encounter for screening for malignant neoplasm of colon: Secondary | ICD-10-CM | POA: Diagnosis not present

## 2024-07-07 DIAGNOSIS — A5901 Trichomonal vulvovaginitis: Secondary | ICD-10-CM

## 2024-07-07 MED ORDER — METRONIDAZOLE 500 MG PO TABS
500.0000 mg | ORAL_TABLET | Freq: Two times a day (BID) | ORAL | 2 refills | Status: AC
  Filled 2024-07-07: qty 14, 7d supply, fill #0
  Filled 2024-07-26: qty 14, 7d supply, fill #1

## 2024-07-12 LAB — CYTOLOGY - PAP
Comment: NEGATIVE
Diagnosis: NEGATIVE
High risk HPV: NEGATIVE

## 2024-07-13 ENCOUNTER — Ambulatory Visit: Payer: Self-pay | Admitting: Nurse Practitioner

## 2024-07-13 ENCOUNTER — Ambulatory Visit: Admitting: Nurse Practitioner

## 2024-07-23 ENCOUNTER — Other Ambulatory Visit (HOSPITAL_COMMUNITY): Payer: Self-pay

## 2024-07-23 MED ORDER — NA SULFATE-K SULFATE-MG SULF 17.5-3.13-1.6 GM/177ML PO SOLN
ORAL | 0 refills | Status: AC
  Filled 2024-07-23: qty 354, 1d supply, fill #0

## 2024-07-26 ENCOUNTER — Other Ambulatory Visit: Payer: Self-pay | Admitting: Nurse Practitioner

## 2024-07-26 ENCOUNTER — Other Ambulatory Visit: Payer: Self-pay

## 2024-07-26 DIAGNOSIS — E669 Obesity, unspecified: Secondary | ICD-10-CM

## 2024-07-27 ENCOUNTER — Other Ambulatory Visit: Payer: Self-pay | Admitting: Nurse Practitioner

## 2024-07-27 ENCOUNTER — Other Ambulatory Visit (HOSPITAL_COMMUNITY): Payer: Self-pay

## 2024-07-27 DIAGNOSIS — M791 Myalgia, unspecified site: Secondary | ICD-10-CM

## 2024-07-28 ENCOUNTER — Other Ambulatory Visit (HOSPITAL_COMMUNITY): Payer: Self-pay

## 2024-07-28 MED ORDER — DICLOFENAC SODIUM 1 % EX GEL
2.0000 g | Freq: Four times a day (QID) | CUTANEOUS | 2 refills | Status: AC
  Filled 2024-07-28: qty 200, 25d supply, fill #0

## 2024-07-30 ENCOUNTER — Other Ambulatory Visit (HOSPITAL_COMMUNITY): Payer: Self-pay

## 2024-07-30 ENCOUNTER — Other Ambulatory Visit: Payer: Self-pay

## 2024-07-30 MED ORDER — MOUNJARO 15 MG/0.5ML ~~LOC~~ SOAJ
15.0000 mg | SUBCUTANEOUS | 1 refills | Status: AC
  Filled 2024-07-30: qty 6, 84d supply, fill #0

## 2024-07-30 NOTE — Unmapped External Note (Signed)
 Assessment Note   Demographics Verification - Call Monitoring - Confidentiality      Member Verification: Member Verification    Call Monitoring Disclaimer: Call Monitoring Disclaimer         Person Providing Info on the Call   Who is the person providing the information on the call? Member      Limitations/Preferences   What, if any, physical limitations, health literacy, language needs and learning preferences do you have that I should be aware of when we talk? Physical- No Limitations      Specify other language limitations        Specify other physical limitations        [facility] Contact Type   Select the Health Your Way contact type: 4th or more contact-Telephonic Chronic Condition     New Diabetes Adult [place] Performance Guarantee                          General Health Perception   How would you describe your health in general? My health is good      SMART GOAL & SMART GOAL Follow Up     SMART GOAL    Did the member set a SMART goal? Yes  Did the member meet their previous SMART goal?  Yes      Brief Summary of Member Status   What medical conditions has your doctor diagnosed you with?    Past Medical History:  Diagnosis Date   Chronic kidney disease    Diabetes mellitus (CMS/HCC)      Did the member score via Care Engine for any condition(s) that he/she did NOT confirm?    HYW:  What medical conditions are you most concerned about and why?    In between MD visits people often find a need to go to the ER or an Urgent Care facilitycan you tell me if you had any concerns or issues in which you went to an ER or Urgent care in the last 12 months?      Condition Specific Metrics   Height/Weight/BMI Height: 5' 5/Weight: 274 lb/BMI (Calculated): 45.6   Blood Pressure    Outcome Metrics  -     Do you currently smoke cigarettes or have you been a smoker in the past month?  No         Medications   Current  Medications[1]    There are no discontinued medications.     Medication Review   Medication Review  Does the member take 10 or more medications?: No Does the member meet any of the following criteria to trigger a medication review?: More than 3 months since last medication review, Other trigger for medication review Specify other trigger for medication review:: nurse discretion ASK IF FULL MEDICATION REVIEW INDICATED: Is there any medication that your doctor has prescribed for you that you have never filled?: No ASK IF FULL MEDICATION REVIEW INDICATED: What issues, if any, sometimes interfere with your taking your medications on time or as prescribed?: No issues Does the member take any of the following medications?: None Has the member been told that he/she should never take any of these medications, or is intolerant or allergic to any of them?: None Grapefruit juice can affect some medications. Do you drink grapefruit juice or eat grapefruits?: No     Depression Screening PHQ2/PHQ9 Exclusions   PHQ2 EXCLUSION CRITERIA:  Do NOT administer the PHQ-2 if any of the following apply to  this member.   PHQ9 EXCLUSION CRITERIA: To determine if this assessment is right for you, I need to ask you some questions before we start.  Besides depression, have you been diagnosed with any other mental health issues such as: No applicable exclusions              Depression PHQ2/PHQ9 Screening Results     PHQ2  The PHQ-2 questions are scored from 0 (not at all) to 3 (nearly every day) and then added together: Score 0-2 = Not likely to be at risk for depression, Score 3-6 = At risk for depression (further screening recommended).        PHQ9 PHQ-9 Mini Module-If diagnosis of depression- show confirmed dx of depression and show PHQ-9 score & score description        Social Drivers of Health   Tobacco Use: Low Risk (07/05/2024)   Received from [facility]   Patient History    Smoking  Tobacco Use: Never    Smokeless Tobacco Use: Never  Physical Activity: Insufficiently Active (10/23/2023)   Exercise Vital Sign    Days of Exercise per Week: 2 days    Minutes of Exercise per Session: 30 min      Lab Results   Results     There are no results available from this visit.         Immunizations   Immunizations Mini Module (show all Q/A pairs answered during this encounter) Immunization Have you had a flu shot in the past 12 months? (note that NASAL flu vaccine is contraindicated with asthma and all DM chronic conditions): Yes (Ages 2 -30 )Have you received a pneumovax vaccine (pneumonia shot)? You may have received this in your doctor's office, during a hospital discharge, pharmacy, clinic, etc.: No Have you had the updated seasonal COVID-19 vaccine?: No, I don't plan to get the updated vaccine.     Intervention/Actions   Education Topics Discussed      Referrals & Referral Follow Up   Referrals    Flowsheet Row Most Recent Value  Please document any referrals to other programs or resources that were made by the staff: None During This Contact         MEP/Mobile App Registration & Education on Tools/Resources   Is the member registered on the Member Engagement Platform (MEP) or Mobile App?  Explain tools and resources available on Member Engagement Platform [facility]) and how to get the most benefit out of them.  Indicate resources reviewed with member: Yes-registered on [facility] 11/21/2023         Next Scheduled Appointment      Date Provider Department Visit Type   09/02/2024 12:00 PM Brandi Zenker Care Management [facility] Coach Follow Up Assmt- 1st Attempt   10/28/2024 11:30 AM Melissa Sherrod Care Management [facility] Nurse Follow Up Assessment- 1st Attempt         Follow Up Needs Identified   Diabetes Status A1C: 5.2 Date of last A1C: 05/2024 Adherence: yes Managing BGs as directed per MD: yes Oral meds or insulin : injectable Pump/CGM  (include type): none Controlled or uncontrolled:  controlled Engagement: quarterly   [place] [facility] Term Date 11/26/2024  07/29/2024  Rate Overall Health: Good  Focus assessment:  [place] [facility]  Mbr states had no new health issues or health concerns since last call. Mbr states has been keeping up with goal that was established with [facility] but did a break over thanksgiving holiday. Mbr A1C did decrease but MD did  not make any new recommendations.   Mbr states Mounjaro  is helping lower A1C but is not supporting weight loss as much as mbr states she hoped it would. Mbr states MD advised that her body or weight has reached a plateau but is not a good candidate for Ozempic. Mbr states don't have the lost of appetite that others are experiencing taking Mounjaro .  Mbr states this year in 2025 weight loss has been a struggle but will stay consistent.   Mbr states clothing size has decreased from 24 to 18/20 overall. Mbr states believes weight loss is coming from diet and exercise but not from taking Mounjaro .  Mbr states MD suggested to count carbs and to pay attention to what she is eating too much of and decrease that and increase activity.  Mbr stated will not committed to counting carbs but will focus on diet and maybe cheese intake and exercise. Mbr had no other questions or concerns  Completed/Educated/Discussed: Provided support and encouragement Completed Med Rev/Med Adherence Updated allergies and Vaccines Updated weight and BMI- 45.60 Educated on Exercise Intensity and Weight Management Educated on Saturated Fats, Dairy and Cholesterol and Weight management  MI completed 10/01/23 PGS completed 10/01/23   Diabetes Metrics Feet Exam- yes--within 12 months  Urine exam- yes within 12 months Eye Exam- Yes within 12 months  Mbr is co-managed with [facility] on Weight Management and has established smart goal with Coach From Coach Notes on Previous call:  Goal: Mbr. will start  Intermittent Fasting and stop eating after 5/6 PM at least 5/6 days/week starting now and continuing through our next conversation.   Confidence Level: 6  POC [place] [facility] Term Date 11/26/2024: goal, A1C, diet, weight, cholesterol, overall health         [1]  Current Outpatient Medications:    tirzepatide  (Mounjaro ) 12.5 mg/0.5 mL pnij, Inject 12.5 mg subcutaneously, Disp: , Rfl:

## 2024-08-17 LAB — HM COLONOSCOPY

## 2024-10-05 ENCOUNTER — Ambulatory Visit: Payer: Self-pay | Admitting: Nurse Practitioner

## 2025-01-20 ENCOUNTER — Encounter: Payer: Self-pay | Admitting: Nurse Practitioner
